# Patient Record
Sex: Female | Born: 1955 | Race: White | Hispanic: No | Smoking: Never smoker
Health system: Southern US, Community
[De-identification: ages and names within clinical notes are randomized; demographics above are authoritative.]

## PROBLEM LIST (undated history)

## (undated) DIAGNOSIS — N059 Unspecified nephritic syndrome with unspecified morphologic changes: Secondary | ICD-10-CM

## (undated) DIAGNOSIS — I509 Heart failure, unspecified: Secondary | ICD-10-CM

## (undated) DIAGNOSIS — I10 Essential (primary) hypertension: Secondary | ICD-10-CM

## (undated) DIAGNOSIS — Z9889 Other specified postprocedural states: Secondary | ICD-10-CM

## (undated) DIAGNOSIS — N289 Disorder of kidney and ureter, unspecified: Secondary | ICD-10-CM

## (undated) DIAGNOSIS — F329 Major depressive disorder, single episode, unspecified: Secondary | ICD-10-CM

## (undated) DIAGNOSIS — M810 Age-related osteoporosis without current pathological fracture: Secondary | ICD-10-CM

## (undated) DIAGNOSIS — G629 Polyneuropathy, unspecified: Secondary | ICD-10-CM

## (undated) DIAGNOSIS — E785 Hyperlipidemia, unspecified: Secondary | ICD-10-CM

## (undated) DIAGNOSIS — M1990 Unspecified osteoarthritis, unspecified site: Secondary | ICD-10-CM

## (undated) DIAGNOSIS — H40009 Preglaucoma, unspecified, unspecified eye: Secondary | ICD-10-CM

## (undated) DIAGNOSIS — E119 Type 2 diabetes mellitus without complications: Secondary | ICD-10-CM

## (undated) DIAGNOSIS — R011 Cardiac murmur, unspecified: Secondary | ICD-10-CM

## (undated) DIAGNOSIS — R112 Nausea with vomiting, unspecified: Secondary | ICD-10-CM

## (undated) DIAGNOSIS — Z9981 Dependence on supplemental oxygen: Secondary | ICD-10-CM

## (undated) DIAGNOSIS — F32A Depression, unspecified: Secondary | ICD-10-CM

## (undated) DIAGNOSIS — C801 Malignant (primary) neoplasm, unspecified: Secondary | ICD-10-CM

## (undated) DIAGNOSIS — K219 Gastro-esophageal reflux disease without esophagitis: Secondary | ICD-10-CM

## (undated) HISTORY — PX: TRACHEOSTOMY: SUR1362

## (undated) HISTORY — DX: Essential (primary) hypertension: I10

## (undated) HISTORY — PX: BREAST LUMPECTOMY: SHX2

## (undated) HISTORY — PX: SPINAL FUSION: SHX223

## (undated) HISTORY — DX: Disorder of kidney and ureter, unspecified: N28.9

## (undated) HISTORY — PX: DILATION AND CURETTAGE OF UTERUS: SHX78

## (undated) HISTORY — DX: Type 2 diabetes mellitus without complications: E11.9

## (undated) HISTORY — DX: Hyperlipidemia, unspecified: E78.5

## (undated) HISTORY — DX: Age-related osteoporosis without current pathological fracture: M81.0

## (undated) HISTORY — PX: OTHER SURGICAL HISTORY: SHX169

## (undated) HISTORY — PX: ORIF FOOT FRACTURE: SHX2123

---

## 2006-08-31 ENCOUNTER — Encounter: Admission: RE | Admit: 2006-08-31 | Discharge: 2006-08-31 | Payer: Self-pay | Admitting: Surgery

## 2015-04-19 LAB — HEMOGLOBIN A1C: HEMOGLOBIN A1C: 11.6

## 2015-05-18 ENCOUNTER — Encounter: Payer: Self-pay | Admitting: "Endocrinology

## 2015-05-18 ENCOUNTER — Ambulatory Visit (INDEPENDENT_AMBULATORY_CARE_PROVIDER_SITE_OTHER): Payer: Medicare PPO | Admitting: "Endocrinology

## 2015-05-18 VITALS — BP 160/85 | HR 82 | Ht 66.0 in | Wt 226.0 lb

## 2015-05-18 DIAGNOSIS — E118 Type 2 diabetes mellitus with unspecified complications: Secondary | ICD-10-CM

## 2015-05-18 DIAGNOSIS — IMO0002 Reserved for concepts with insufficient information to code with codable children: Secondary | ICD-10-CM | POA: Insufficient documentation

## 2015-05-18 DIAGNOSIS — Z794 Long term (current) use of insulin: Secondary | ICD-10-CM

## 2015-05-18 DIAGNOSIS — I10 Essential (primary) hypertension: Secondary | ICD-10-CM | POA: Diagnosis not present

## 2015-05-18 DIAGNOSIS — E1165 Type 2 diabetes mellitus with hyperglycemia: Secondary | ICD-10-CM

## 2015-05-18 DIAGNOSIS — E782 Mixed hyperlipidemia: Secondary | ICD-10-CM | POA: Insufficient documentation

## 2015-05-18 DIAGNOSIS — E785 Hyperlipidemia, unspecified: Secondary | ICD-10-CM

## 2015-05-18 MED ORDER — CANAGLIFLOZIN 100 MG PO TABS: 100.0000 mg | ORAL_TABLET | Freq: Every day | ORAL | Status: DC

## 2015-05-18 NOTE — Progress Notes (Signed)
Subjective:    Patient ID: Savannah Miles, female    DOB: 29-May-1955. Patient is being seen in consultation for management of diabetes requested by  Kennieth Rad, MD  Past Medical History  Diagnosis Date  . Diabetes mellitus, type II ([place])   . Hypertension   . Hyperlipidemia   . Kidney disease   . Osteoporosis    Past Surgical History  Procedure Laterality Date  . Spinal fusion    . Breast lumpectomy    . Tracheostomy    . Other surgical history  Leg, Arm, Hip fx   Social History   Social History  . Marital Status: Divorced    Spouse Name: N/A  . Number of Children: N/A  . Years of Education: N/A   Social History Main Topics  . Smoking status: Never Smoker   . Smokeless tobacco: None  . Alcohol Use: No  . Drug Use: No  . Sexual Activity: Not Asked   Other Topics Concern  . None   Social History Narrative  . None   Outpatient Encounter Prescriptions as of 05/18/2015  Medication Sig  . amLODipine (NORVASC) 10 MG tablet Take 10 mg by mouth daily.  Marland Kitchen atorvastatin (LIPITOR) 20 MG tablet Take 20 mg by mouth daily.  Marland Kitchen buPROPion (WELLBUTRIN) 75 MG tablet Take 75 mg by mouth daily.  . canagliflozin (INVOKANA) 100 MG TABS tablet Take 1 tablet (100 mg total) by mouth daily before breakfast.  . citalopram (CELEXA) 40 MG tablet Take 40 mg by mouth daily.  Marland Kitchen gabapentin (NEURONTIN) 800 MG tablet Take 800 mg by mouth. 5 x daily  . hydrochlorothiazide (HYDRODIURIL) 25 MG tablet Take 25 mg by mouth daily.  Marland Kitchen HYDROcodone-acetaminophen (NORCO/VICODIN) 5-325 MG tablet Take 1 tablet by mouth every 6 (six) hours as needed for moderate pain.  . Insulin Glargine (TOUJEO SOLOSTAR) 300 UNIT/ML SOPN Inject 60 Units into the skin at bedtime.  . insulin lispro (HUMALOG KWIKPEN) 100 UNIT/ML KiwkPen Inject 10-16 Units into the skin 3 (three) times daily with meals.  . isosorbide mononitrate (IMDUR) 30 MG 24 hr tablet Take 30 mg by mouth daily.  Marland Kitchen losartan (COZAAR) 100 MG tablet Take 100 mg  by mouth daily.  . metFORMIN (GLUCOPHAGE) 1000 MG tablet Take 1,000 mg by mouth 2 (two) times daily with a meal.  . metoprolol tartrate (LOPRESSOR) 25 MG tablet Take 25 mg by mouth 2 (two) times daily.  Marland Kitchen omeprazole (PRILOSEC) 40 MG capsule Take 40 mg by mouth daily.  . Vitamin D, Ergocalciferol, (DRISDOL) 50000 units CAPS capsule Take 50,000 Units by mouth every 7 (seven) days.  . [DISCONTINUED] phentermine 37.5 MG capsule Take 37.5 mg by mouth every morning.   No facility-administered encounter medications on file as of 05/18/2015.   ALLERGIES: Allergies not on file VACCINATION STATUS:  There is no immunization history on file for this patient.  Diabetes She presents for her initial diabetic visit. She has type 2 diabetes mellitus. Onset time: Patient was diagnosed at approximate age of 43 years. Her disease course has been worsening. There are no hypoglycemic associated symptoms. Pertinent negatives for hypoglycemia include no confusion, headaches, pallor or seizures. Associated symptoms include fatigue, polydipsia and polyuria. Pertinent negatives for diabetes include no chest pain, no polyphagia and no weight loss. There are no hypoglycemic complications. Symptoms are worsening. There are no diabetic complications. Risk factors for coronary artery disease include diabetes mellitus, dyslipidemia, hypertension and sedentary lifestyle. Current diabetic treatment includes insulin injections and oral agent (monotherapy) (  She is on toujeo 38 units in the morning and 68 units at bedtime, Humalog 28 units 3 times a day with meals. She is also on metformin 1000 mg by mouth twice a day.). Her weight is increasing steadily. She has not had a previous visit with a dietitian. She participates in exercise intermittently. Home blood sugar record trend: She did not bring the meter nor log with her to review today. An ACE inhibitor/angiotensin II receptor blocker is being taken.  Hyperlipidemia This is a  chronic problem. The current episode started more than 1 year ago. Pertinent negatives include no chest pain, myalgias or shortness of breath. Current antihyperlipidemic treatment includes statins. Risk factors for coronary artery disease include diabetes mellitus, dyslipidemia, hypertension, obesity and a sedentary lifestyle.  Hypertension This is a chronic problem. The current episode started more than 1 year ago. Pertinent negatives include no chest pain, headaches, palpitations or shortness of breath. Risk factors for coronary artery disease include diabetes mellitus, dyslipidemia, obesity and sedentary lifestyle. Past treatments include angiotensin blockers.      Review of Systems  Constitutional: Positive for fatigue. Negative for fever, chills, weight loss and unexpected weight change.  HENT: Negative for trouble swallowing and voice change.   Eyes: Negative for visual disturbance.  Respiratory: Negative for cough, shortness of breath and wheezing.   Cardiovascular: Negative for chest pain, palpitations and leg swelling.  Gastrointestinal: Negative for nausea, vomiting and diarrhea.  Endocrine: Positive for polydipsia and polyuria. Negative for cold intolerance, heat intolerance and polyphagia.  Musculoskeletal: Negative for myalgias and arthralgias.  Skin: Negative for color change, pallor, rash and wound.  Neurological: Negative for seizures and headaches.  Psychiatric/Behavioral: Negative for suicidal ideas and confusion.    Objective:    BP 160/85 mmHg  Pulse 82  Ht 5\' 6"  (1.676 m)  Wt 226 lb (102.513 kg)  BMI 36.49 kg/m2  SpO2 98%  Wt Readings from Last 3 Encounters:  05/18/15 226 lb (102.513 kg)    Physical Exam  Constitutional: She is oriented to person, place, and time. She appears well-developed.  HENT:  Head: Normocephalic and atraumatic.  Eyes: EOM are normal.  Neck: Normal range of motion. Neck supple. No tracheal deviation present. No thyromegaly present.   Cardiovascular: Normal rate and regular rhythm.   Murmur heard. She has systolic murmur which radiates to the left side of the neck. She is following with cardiology had echocardiogram in the last 2 years. Records not available to review. I advised her to continue follow up closely with cardiologist.   Pulmonary/Chest: Effort normal and breath sounds normal.  Abdominal: Soft. Bowel sounds are normal. There is no tenderness. There is no guarding.  Musculoskeletal: Normal range of motion. She exhibits no edema.  Neurological: She is alert and oriented to person, place, and time. She has normal reflexes. No cranial nerve deficit. Coordination normal.  Skin: Skin is warm and dry. No rash noted. No erythema. No pallor.  Psychiatric: She has a normal mood and affect. Judgment normal.     Diabetic Labs (most recent): Lab Results  Component Value Date   HGBA1C 11.6 04/19/2015    Labs from July 2016 included A1c of 8.5%, completely labs to be scanned into her records.   Assessment & Plan:   1. Uncontrolled type 2 diabetes mellitus with complication, with long-term current use of insulin ([place])  - Patient has currently uncontrolled symptomatic type 2 DM since  60 years of age,  with most recent A1c of 11.5 %.  Recent labs reviewed.   Her diabetes is complicated by obesity and patient remains at a high risk for more acute and chronic complications of diabetes which include CAD, CVA, CKD, retinopathy, and neuropathy. These are all discussed in detail with the patient.  - I have counseled the patient on diet management and weight loss, by adopting a carbohydrate restricted/protein rich diet.  - Suggestion is made for patient to avoid simple carbohydrates   from their diet including Cakes , Desserts, Ice Cream,  Soda (  diet and regular) , Sweet Tea , Candies,  Chips, Cookies, Artificial Sweeteners,   and "Sugar-free" Products . This will help patient to have stable blood glucose profile and  potentially avoid unintended weight gain.  - I encouraged the patient to switch to  unprocessed or minimally processed complex starch and increased protein intake (animal or plant source), fruits, and vegetables.  - Patient is advised to stick to a routine mealtimes to eat 3 meals  a day and avoid unnecessary snacks ( to snack only to correct hypoglycemia).  - The patient will be scheduled with Jearld Fenton, RDN, CDE for individualized DM education.  - I have approached patient with the following individualized plan to manage diabetes and patient agrees:   - I  will proceed to readjust her basal insulin  Toujeo to 60 units daily at bedtime, and lower her  prandial insulin Humalog to 10 units TIDAC for pre-meal BG readings of 90-150mg /dl, plus patient specific correction dose for unexpected hyperglycemia above 150mg /dl, associated with strict monitoring of glucose  AC and HS. - Patient is warned not to take insulin without proper monitoring per orders. -Adjustment parameters are given for hypo and hyperglycemia in writing. -Patient is encouraged to call clinic for blood glucose levels less than 70 or above 300 mg /dl. - I will conmetformin 1000 mg by mouth twice a day, therapeutically suitable for patient. -I will add invokana 100 mg by mouth daily, side effects and precautions discussed with patient.   - Patient will be considered for incretin therapy as appropriate next visit. - Patient specific target  A1c;  LDL, HDL, Triglycerides, and  Waist Circumference were discussed in detail.  2) BP/HTN: uncontrolled. Continue current medications including ACEI/ARB. 3) Lipids/HPL:  control unknown, continue statins. 4)  Weight/Diet:   patient is obese, CDE Consult will be initiated , exercise, and detailed carbohydrates information provided. given her significant cardiac murmur, I advised her to discontinue phentermine for now.  5) Chronic Care/Health Maintenance:  -Patient is on ACEI/ARB and  Statin medications and encouraged to continue to follow up with Ophthalmology, Podiatrist at least yearly or according to recommendations, and advised to   stay away from smoking. I have recommended yearly flu vaccine and pneumonia vaccination at least every 5 years; moderate intensity exercise for up to 150 minutes weekly; and  sleep for at least 7 hours a day.  - 60 minutes of time was spent on the care of this patient , 50% of which was applied for counseling on diabetes complications and their preventions.  - Patient to bring meter and  blood glucose logs during their next visit.   - I advised patient to maintain close follow up with Kennieth Rad, MD for primary care needs.  Follow up plan: - Return in about 1 week (around 05/25/2015) for diabetes, high blood pressure, high cholesterol, follow up with meter and logs- no labs.  Glade Lloyd, MD Phone: [phone]  Fax: [phone]   05/18/2015, 10:55 AM

## 2015-05-18 NOTE — Patient Instructions (Signed)

## 2015-05-30 ENCOUNTER — Ambulatory Visit: Payer: Medicare PPO | Admitting: "Endocrinology

## 2015-06-06 ENCOUNTER — Ambulatory Visit: Payer: Medicare PPO | Admitting: "Endocrinology

## 2015-06-08 ENCOUNTER — Encounter: Payer: Self-pay | Admitting: Nutrition

## 2015-06-08 ENCOUNTER — Encounter: Payer: Medicare PPO | Attending: "Endocrinology | Admitting: Nutrition

## 2015-06-08 VITALS — Ht 67.0 in | Wt 214.0 lb

## 2015-06-08 DIAGNOSIS — IMO0002 Reserved for concepts with insufficient information to code with codable children: Secondary | ICD-10-CM

## 2015-06-08 DIAGNOSIS — Z794 Long term (current) use of insulin: Secondary | ICD-10-CM | POA: Diagnosis present

## 2015-06-08 DIAGNOSIS — E118 Type 2 diabetes mellitus with unspecified complications: Secondary | ICD-10-CM | POA: Diagnosis not present

## 2015-06-08 DIAGNOSIS — E1165 Type 2 diabetes mellitus with hyperglycemia: Secondary | ICD-10-CM

## 2015-06-08 DIAGNOSIS — E669 Obesity, unspecified: Secondary | ICD-10-CM

## 2015-06-08 NOTE — Progress Notes (Signed)
  Medical Nutrition Therapy:  Appt start time: 1330 end time:  1430.   Assessment:  Primary concerns today: Diabetes Type 2. A1C 11.6% 1/17. . Lives with herself. Eats meals eaten at home. She doesn't cook a lot; gets more conveniet foods. 60 units of Toujeo and 10 units of Humlog with meals. Eates three meals per day. Testing blood sugar 4 times per day. Injects in stomach area mostly. Physical actvity: soemtimes goes to water aerobics. Has broke left hip a few years ago. Diet has improved over the last few week since she has been seeing Dr. Dorris Fetch. Trying to avoid snacks and eating better balanced meals. Diet needs to be low fat low sodium high fiber.    Lab Results  Component Value Date   HGBA1C 11.6 04/19/2015    Preferred Learning Style:   No preference indicated   Learning Readiness:  Ready  Change in progress  MEDICATIONS: See list  24-hr recall:  B ( AM): egg 2, bacon or oatmeal,  and water Snk ( AM): celery L ( PM): Hot dog with bun and water with mustard, chili and slaw Snk ( PM):  D ( PM): Spaghetti 2 cup, 1 bread stick, water. Snk ( PM): none Beverages: water  Usual physical activity: walks some  Estimated energy needs: 1500 calories 170 g carbohydrates 112 g protein 42 g fat  Progress Towards Goal(s):  In progress.   Nutritional Diagnosis:  NB-1.1 Food and nutrition-related knowledge deficit As related to Diabetes.  As evidenced by A1C 11.7%.    Intervention:  Nutrition and Diabetes education provided on My Plate, CHO counting, meal planning, portion sizes, timing of meals, avoiding snacks between meals unless having a low blood sugar, target ranges for A1C and blood sugars, signs/symptoms and treatment of hyper/hypoglycemia, monitoring blood sugars, taking medications as prescribed, benefits of exercising 30 minutes per day and prevention of complications of DM.  Goals 1. Follow My Plate Method 2 Avoid snacks between meals 3. Take insulin as  prescribed: Toujeo at night and Humalog/Novolog with meals. 4. Increase water intake 5. Walk 15-30 minutes daily. 6. Do not skip meals. 7. Get A1C down to 8% in three months.   Teaching Method Utilized:  Visual Auditory Hands on  Handouts given during visit include:  The Plate Method  Meal Plan Card  Diabetes Instructions   Barriers to learning/adherence to lifestyle change:  None  Demonstrated degree of understanding via:  Teach Back   Monitoring/Evaluation:  Dietary intake, exercise,  Meal planning, SBG, and body weight in 1 month(s).

## 2015-06-12 ENCOUNTER — Ambulatory Visit: Payer: Medicare PPO | Admitting: "Endocrinology

## 2015-06-12 NOTE — Patient Instructions (Addendum)
Goals 1. Follow My Plate Method 2 Avoid snacks between meals 3. Take insulin as prescribed: Toujeo at night and Humalog/Novolog with meals. 4. Increase water intake 5. Walk 15-30 minutes daily. 6. Do not skip meals. 7. Get A1C down to 8% in three months

## 2015-06-21 ENCOUNTER — Encounter: Payer: Self-pay | Admitting: "Endocrinology

## 2015-06-21 ENCOUNTER — Ambulatory Visit (INDEPENDENT_AMBULATORY_CARE_PROVIDER_SITE_OTHER): Payer: Medicare PPO | Admitting: "Endocrinology

## 2015-06-21 VITALS — BP 122/74 | HR 63 | Ht 67.0 in | Wt 220.0 lb

## 2015-06-21 DIAGNOSIS — E118 Type 2 diabetes mellitus with unspecified complications: Secondary | ICD-10-CM

## 2015-06-21 DIAGNOSIS — E785 Hyperlipidemia, unspecified: Secondary | ICD-10-CM | POA: Diagnosis not present

## 2015-06-21 DIAGNOSIS — IMO0002 Reserved for concepts with insufficient information to code with codable children: Secondary | ICD-10-CM

## 2015-06-21 DIAGNOSIS — I10 Essential (primary) hypertension: Secondary | ICD-10-CM | POA: Diagnosis not present

## 2015-06-21 DIAGNOSIS — Z794 Long term (current) use of insulin: Secondary | ICD-10-CM

## 2015-06-21 DIAGNOSIS — E1165 Type 2 diabetes mellitus with hyperglycemia: Secondary | ICD-10-CM

## 2015-06-21 NOTE — Progress Notes (Signed)
Subjective:    Patient ID: Savannah Miles, female    DOB: 05-30-1955. Patient is being seen in consultation for management of diabetes requested by  Kennieth Rad, MD  Past Medical History  Diagnosis Date  . Diabetes mellitus, type II ([place])   . Hypertension   . Hyperlipidemia   . Kidney disease   . Osteoporosis    Past Surgical History  Procedure Laterality Date  . Spinal fusion    . Breast lumpectomy    . Tracheostomy    . Other surgical history  Leg, Arm, Hip fx   Social History   Social History  . Marital Status: Divorced    Spouse Name: N/A  . Number of Children: N/A  . Years of Education: N/A   Social History Main Topics  . Smoking status: Never Smoker   . Smokeless tobacco: None  . Alcohol Use: No  . Drug Use: No  . Sexual Activity: Not Asked   Other Topics Concern  . None   Social History Narrative   Outpatient Encounter Prescriptions as of 06/21/2015  Medication Sig  . amLODipine (NORVASC) 10 MG tablet Take 10 mg by mouth daily.  Marland Kitchen atorvastatin (LIPITOR) 20 MG tablet Take 20 mg by mouth daily.  Marland Kitchen buPROPion (WELLBUTRIN) 75 MG tablet Take 75 mg by mouth daily.  . citalopram (CELEXA) 40 MG tablet Take 40 mg by mouth daily.  Marland Kitchen gabapentin (NEURONTIN) 800 MG tablet Take 800 mg by mouth. 5 x daily  . hydrochlorothiazide (HYDRODIURIL) 25 MG tablet Take 25 mg by mouth daily.  Marland Kitchen HYDROcodone-acetaminophen (NORCO/VICODIN) 5-325 MG tablet Take 1 tablet by mouth every 6 (six) hours as needed for moderate pain.  . Insulin Glargine (TOUJEO SOLOSTAR) 300 UNIT/ML SOPN Inject 60 Units into the skin at bedtime.  . insulin lispro (HUMALOG KWIKPEN) 100 UNIT/ML KiwkPen Inject 10-16 Units into the skin 3 (three) times daily with meals.  . isosorbide mononitrate (IMDUR) 30 MG 24 hr tablet Take 30 mg by mouth daily.  Marland Kitchen losartan (COZAAR) 100 MG tablet Take 100 mg by mouth daily.  . metFORMIN (GLUCOPHAGE) 1000 MG tablet Take 1,000 mg by mouth 2 (two) times daily with a meal.  .  metoprolol tartrate (LOPRESSOR) 25 MG tablet Take 25 mg by mouth 2 (two) times daily.  Marland Kitchen omeprazole (PRILOSEC) 40 MG capsule Take 40 mg by mouth daily.  . Vitamin D, Ergocalciferol, (DRISDOL) 50000 units CAPS capsule Take 50,000 Units by mouth every 7 (seven) days.  . [DISCONTINUED] canagliflozin (INVOKANA) 100 MG TABS tablet Take 1 tablet (100 mg total) by mouth daily before breakfast. (Patient not taking: Reported on 06/21/2015)   No facility-administered encounter medications on file as of 06/21/2015.   ALLERGIES: No Known Allergies VACCINATION STATUS:  There is no immunization history on file for this patient.  Diabetes She presents for her initial diabetic visit. She has type 2 diabetes mellitus. Onset time: Patient was diagnosed at approximate age of 37 years. Her disease course has been improving. There are no hypoglycemic associated symptoms. Pertinent negatives for hypoglycemia include no confusion, headaches, pallor or seizures. Associated symptoms include fatigue and polydipsia. Pertinent negatives for diabetes include no chest pain, no polyphagia, no polyuria and no weight loss. There are no hypoglycemic complications. Symptoms are worsening. There are no diabetic complications. Risk factors for coronary artery disease include diabetes mellitus, dyslipidemia, hypertension and sedentary lifestyle. Current diabetic treatment includes insulin injections and oral agent (monotherapy) (She is on toujeo 38 units in the morning and  68 units at bedtime, Humalog 28 units 3 times a day with meals. She is also on metformin 1000 mg by mouth twice a day.). Her weight is decreasing steadily (She lost 6 pounds when she was on Invokana, however her insurance did not pay for Invokana and could not continue.). She has not had a previous visit with a dietitian. She participates in exercise intermittently. Home blood sugar record trend: She did  bring a meter showing 1-2 readings per day, despite my advice for  her to test 4 times a day.  Her overall blood glucose range is 180-200 mg/dl. An ACE inhibitor/angiotensin II receptor blocker is being taken.  Hyperlipidemia This is a chronic problem. The current episode started more than 1 year ago. Pertinent negatives include no chest pain, myalgias or shortness of breath. Current antihyperlipidemic treatment includes statins. Risk factors for coronary artery disease include diabetes mellitus, dyslipidemia, hypertension, obesity and a sedentary lifestyle.  Hypertension This is a chronic problem. The current episode started more than 1 year ago. Pertinent negatives include no chest pain, headaches, palpitations or shortness of breath. Risk factors for coronary artery disease include diabetes mellitus, dyslipidemia, obesity and sedentary lifestyle. Past treatments include angiotensin blockers.      Review of Systems  Constitutional: Positive for fatigue. Negative for fever, chills, weight loss and unexpected weight change.  HENT: Negative for trouble swallowing and voice change.   Eyes: Negative for visual disturbance.  Respiratory: Negative for cough, shortness of breath and wheezing.   Cardiovascular: Negative for chest pain, palpitations and leg swelling.  Gastrointestinal: Negative for nausea, vomiting and diarrhea.  Endocrine: Positive for polydipsia. Negative for cold intolerance, heat intolerance, polyphagia and polyuria.  Musculoskeletal: Negative for myalgias and arthralgias.  Skin: Negative for color change, pallor, rash and wound.  Neurological: Negative for seizures and headaches.  Psychiatric/Behavioral: Negative for suicidal ideas and confusion.    Objective:    BP 122/74 mmHg  Pulse 63  Ht 5\' 7"  (1.702 m)  Wt 220 lb (99.791 kg)  BMI 34.45 kg/m2  SpO2 95%  Wt Readings from Last 3 Encounters:  06/21/15 220 lb (99.791 kg)  06/08/15 214 lb (97.07 kg)  05/18/15 226 lb (102.513 kg)    Physical Exam  Constitutional: She is oriented to  person, place, and time. She appears well-developed.  HENT:  Head: Normocephalic and atraumatic.  Eyes: EOM are normal.  Neck: Normal range of motion. Neck supple. No tracheal deviation present. No thyromegaly present.  Cardiovascular: Normal rate and regular rhythm.   Murmur heard. She has systolic murmur which radiates to the left side of the neck. She is following with cardiology had echocardiogram in the last 2 years. Records not available to review. I advised her to continue follow up closely with cardiologist.   Pulmonary/Chest: Effort normal and breath sounds normal.  Abdominal: Soft. Bowel sounds are normal. There is no tenderness. There is no guarding.  Musculoskeletal: Normal range of motion. She exhibits no edema.  Neurological: She is alert and oriented to person, place, and time. She has normal reflexes. No cranial nerve deficit. Coordination normal.  Skin: Skin is warm and dry. No rash noted. No erythema. No pallor.  Psychiatric: She has a normal mood and affect. Judgment normal.     Diabetic Labs (most recent): Lab Results  Component Value Date   HGBA1C 11.6 04/19/2015    Labs from July 2016 included A1c of 8.5%, completely labs to be scanned into her records.   Assessment & Plan:  1. Uncontrolled type 2 diabetes mellitus with complication, with long-term current use of insulin ([place])  - Patient has currently uncontrolled symptomatic type 2 DM since  60 years of age,  with most recent A1c of 11.5 %. Recent labs reviewed.   Her diabetes is complicated by obesity and patient remains at a high risk for more acute and chronic complications of diabetes which include CAD, CVA, CKD, retinopathy, and neuropathy. These are all discussed in detail with the patient.  - I have counseled the patient on diet management and weight loss, by adopting a carbohydrate restricted/protein rich diet.  - Suggestion is made for patient to avoid simple carbohydrates   from their diet  including Cakes , Desserts, Ice Cream,  Soda (  diet and regular) , Sweet Tea , Candies,  Chips, Cookies, Artificial Sweeteners,   and "Sugar-free" Products . This will help patient to have stable blood glucose profile and potentially avoid unintended weight gain.  - I encouraged the patient to switch to  unprocessed or minimally processed complex starch and increased protein intake (animal or plant source), fruits, and vegetables.  - Patient is advised to stick to a routine mealtimes to eat 3 meals  a day and avoid unnecessary snacks ( to snack only to correct hypoglycemia).  - The patient will be scheduled with Jearld Fenton, RDN, CDE for individualized DM education.  - I have approached patient with the following individualized plan to manage diabetes and patient agrees:   - She came with inadequate monitoring and was no logs. I  will continue with basal insulin Toujeo  60 units daily at bedtime, and  Humalog  10 units TIDAC for pre-meal BG readings of 90-150mg /dl, plus patient specific correction dose for unexpected hyperglycemia above 150mg /dl, associated with strict monitoring of glucose  AC and HS. - Patient is warned not to take insulin without proper monitoring per orders. -Adjustment parameters are given for hypo and hyperglycemia in writing. -Patient is encouraged to call clinic for blood glucose levels less than 70 or above 300 mg /dl. - I will conmetformin 1000 mg by mouth twice a day, therapeutically suitable for patient. - She could not afford invokana   after free trial for 30 days. I gave her  a brochure for patient assistance program for [employer].     - Patient will be considered for incretin therapy as appropriate next visit. - Patient specific target  A1c;  LDL, HDL, Triglycerides, and  Waist Circumference were discussed in detail.  2) BP/HTN: uncontrolled. Continue current medications including ACEI/ARB. 3) Lipids/HPL:  control unknown, continue statins. 4)   Weight/Diet:   patient is obese, CDE Consult will be initiated , exercise, and detailed carbohydrates information provided. given her significant cardiac murmur, I advised her to discontinue phentermine for now.  5) Chronic Care/Health Maintenance:  -Patient is on ACEI/ARB and Statin medications and encouraged to continue to follow up with Ophthalmology, Podiatrist at least yearly or according to recommendations, and advised to   stay away from smoking. I have recommended yearly flu vaccine and pneumonia vaccination at least every 5 years; moderate intensity exercise for up to 150 minutes weekly; and  sleep for at least 7 hours a day.  - 25 minutes of time was spent on the care of this patient , 50% of which was applied for counseling on diabetes complications and their preventions.  - Patient to bring meter and  blood glucose logs during their next visit.   - I advised patient  to maintain close follow up with Kennieth Rad, MD for primary care needs.  Follow up plan: - Return in about 8 weeks (around 08/16/2015) for diabetes, high blood pressure, high cholesterol, follow up with pre-visit labs, meter, and logs.  Glade Lloyd, MD Phone: [phone]  Fax: [phone]   06/21/2015, 2:30 PM

## 2015-06-21 NOTE — Patient Instructions (Signed)
Advice for weight management -For most of us the best way to lose weight is by diet management. Generally speaking, diet management means restricting carbohydrate consumption to minimum possible (and to unprocessed or minimally processed complex starch) and increasing protein intake (animal or plant source), fruits, and vegetables.  -Sticking to a routine mealtime to eat 3 meals a day and avoiding unnecessary snacks is shown to have a big role in weight control.  -It is better to avoid simple carbohydrates including: Cakes, Desserts, Ice Cream, Soda (diet and regular), Sweet Tea, Candies, Chips, Cookies, Artificial Sweeteners, and "Sugar-free" Products.   -Exercise: 30 minutes a day 3-4 days a week, or 150 minutes a week. Combine stretch, strength, and aerobic activities. You may seek evaluation by your heart doctor prior to initiating exercise if you have high risk for heart disease.  -If you are interested, we can schedule a visit with Savannah Miles, RDN, CDE for individualized nutrition education.  

## 2015-07-13 LAB — HEMOGLOBIN A1C: Hemoglobin A1C: 9.2

## 2015-07-17 ENCOUNTER — Ambulatory Visit: Payer: Medicare PPO | Admitting: Nutrition

## 2015-07-26 ENCOUNTER — Encounter (INDEPENDENT_AMBULATORY_CARE_PROVIDER_SITE_OTHER): Payer: Self-pay | Admitting: *Deleted

## 2015-08-15 ENCOUNTER — Encounter: Payer: Self-pay | Admitting: "Endocrinology

## 2015-08-15 ENCOUNTER — Ambulatory Visit (INDEPENDENT_AMBULATORY_CARE_PROVIDER_SITE_OTHER): Payer: Medicare PPO | Admitting: "Endocrinology

## 2015-08-15 ENCOUNTER — Ambulatory Visit: Payer: Medicare PPO | Admitting: "Endocrinology

## 2015-08-15 VITALS — BP 115/62 | HR 70 | Ht 67.0 in | Wt 228.0 lb

## 2015-08-15 DIAGNOSIS — E785 Hyperlipidemia, unspecified: Secondary | ICD-10-CM

## 2015-08-15 DIAGNOSIS — Z794 Long term (current) use of insulin: Secondary | ICD-10-CM | POA: Diagnosis not present

## 2015-08-15 DIAGNOSIS — E1165 Type 2 diabetes mellitus with hyperglycemia: Secondary | ICD-10-CM | POA: Diagnosis not present

## 2015-08-15 DIAGNOSIS — E118 Type 2 diabetes mellitus with unspecified complications: Secondary | ICD-10-CM | POA: Diagnosis not present

## 2015-08-15 DIAGNOSIS — I10 Essential (primary) hypertension: Secondary | ICD-10-CM | POA: Diagnosis not present

## 2015-08-15 DIAGNOSIS — IMO0002 Reserved for concepts with insufficient information to code with codable children: Secondary | ICD-10-CM

## 2015-08-15 NOTE — Progress Notes (Signed)
Subjective:    Patient ID: Savannah Miles, female    DOB: 1955/07/03. Patient is being seen in Follow-up for management of diabetes requested by  Kennieth Rad, MD  Past Medical History  Diagnosis Date  . Diabetes mellitus, type II ([place] [address])   . Hypertension   . Hyperlipidemia   . Kidney disease   . Osteoporosis    Past Surgical History  Procedure Laterality Date  . Spinal fusion    . Breast lumpectomy    . Tracheostomy    . Other surgical history  Leg, Arm, Hip fx   Social History   Social History  . Marital Status: Divorced    Spouse Name: N/A  . Number of Children: N/A  . Years of Education: N/A   Social History Main Topics  . Smoking status: Never Smoker   . Smokeless tobacco: None  . Alcohol Use: No  . Drug Use: No  . Sexual Activity: Not Asked   Other Topics Concern  . None   Social History Narrative   Outpatient Encounter Prescriptions as of 08/15/2015  Medication Sig  . amLODipine (NORVASC) 10 MG tablet Take 10 mg by mouth daily.  Marland Kitchen atorvastatin (LIPITOR) 20 MG tablet Take 20 mg by mouth daily.  Marland Kitchen buPROPion (WELLBUTRIN) 75 MG tablet Take 75 mg by mouth daily.  . citalopram (CELEXA) 40 MG tablet Take 40 mg by mouth daily.  Marland Kitchen gabapentin (NEURONTIN) 800 MG tablet Take 800 mg by mouth. 5 x daily  . hydrochlorothiazide (HYDRODIURIL) 25 MG tablet Take 25 mg by mouth daily.  Marland Kitchen HYDROcodone-acetaminophen (NORCO/VICODIN) 5-325 MG tablet Take 1 tablet by mouth every 6 (six) hours as needed for moderate pain.  . Insulin Glargine (TOUJEO SOLOSTAR) 300 UNIT/ML SOPN Inject 70 Units into the skin at bedtime.  . insulin lispro (HUMALOG KWIKPEN) 100 UNIT/ML KiwkPen Inject 10-16 Units into the skin 3 (three) times daily with meals.  . isosorbide mononitrate (IMDUR) 30 MG 24 hr tablet Take 30 mg by mouth daily.  Marland Kitchen losartan (COZAAR) 100 MG tablet Take 100 mg by mouth daily.  . metFORMIN (GLUCOPHAGE) 1000 MG tablet Take 1,000 mg by mouth 2 (two) times daily with a meal.  .  metoprolol tartrate (LOPRESSOR) 25 MG tablet Take 25 mg by mouth 2 (two) times daily.  Marland Kitchen omeprazole (PRILOSEC) 40 MG capsule Take 40 mg by mouth daily.  . Vitamin D, Ergocalciferol, (DRISDOL) 50000 units CAPS capsule Take 50,000 Units by mouth every 7 (seven) days.   No facility-administered encounter medications on file as of 08/15/2015.   ALLERGIES: No Known Allergies VACCINATION STATUS:  There is no immunization history on file for this patient.  Diabetes She presents for her initial diabetic visit. She has type 2 diabetes mellitus. Onset time: Patient was diagnosed at approximate age of 60 years. Her disease course has been improving. There are no hypoglycemic associated symptoms. Pertinent negatives for hypoglycemia include no confusion, headaches, pallor or seizures. Associated symptoms include fatigue and polydipsia. Pertinent negatives for diabetes include no chest pain, no polyphagia, no polyuria and no weight loss. There are no hypoglycemic complications. Symptoms are worsening. There are no diabetic complications. Risk factors for coronary artery disease include diabetes mellitus, dyslipidemia, hypertension and sedentary lifestyle. Current diabetic treatment includes insulin injections and oral agent (monotherapy) (She is on toujeo 38 units in the morning and 68 units at bedtime, Humalog 28 units 3 times a day with meals. She is also on metformin 1000 mg by mouth twice a day.). Her  weight is increasing steadily. She is following a generally unhealthy diet. She has not had a previous visit with a dietitian. She participates in exercise intermittently. Her breakfast blood glucose range is generally 180-200 mg/dl. Her lunch blood glucose range is generally 180-200 mg/dl. Her dinner blood glucose range is generally 180-200 mg/dl. Her overall blood glucose range is 180-200 mg/dl. An ACE inhibitor/angiotensin II receptor blocker is being taken.  Hyperlipidemia This is a chronic problem. The  current episode started more than 1 year ago. Pertinent negatives include no chest pain, myalgias or shortness of breath. Current antihyperlipidemic treatment includes statins. Risk factors for coronary artery disease include diabetes mellitus, dyslipidemia, hypertension, obesity and a sedentary lifestyle.  Hypertension This is a chronic problem. The current episode started more than 1 year ago. Pertinent negatives include no chest pain, headaches, palpitations or shortness of breath. Risk factors for coronary artery disease include diabetes mellitus, dyslipidemia, obesity and sedentary lifestyle. Past treatments include angiotensin blockers.      Review of Systems  Constitutional: Positive for fatigue. Negative for fever, chills, weight loss and unexpected weight change.  HENT: Negative for trouble swallowing and voice change.   Eyes: Negative for visual disturbance.  Respiratory: Negative for cough, shortness of breath and wheezing.   Cardiovascular: Negative for chest pain, palpitations and leg swelling.  Gastrointestinal: Negative for nausea, vomiting and diarrhea.  Endocrine: Positive for polydipsia. Negative for cold intolerance, heat intolerance, polyphagia and polyuria.  Musculoskeletal: Negative for myalgias and arthralgias.  Skin: Negative for color change, pallor, rash and wound.  Neurological: Negative for seizures and headaches.  Psychiatric/Behavioral: Negative for suicidal ideas and confusion.    Objective:    BP 115/62 mmHg  Pulse 70  Ht 5\' 7"  (1.702 m)  Wt 228 lb (103.42 kg)  BMI 35.70 kg/m2  SpO2 97%  Wt Readings from Last 3 Encounters:  08/15/15 228 lb (103.42 kg)  06/21/15 220 lb (99.791 kg)  06/08/15 214 lb (97.07 kg)    Physical Exam  Constitutional: She is oriented to person, place, and time. She appears well-developed.  HENT:  Head: Normocephalic and atraumatic.  Eyes: EOM are normal.  Neck: Normal range of motion. Neck supple. No tracheal deviation  present. No thyromegaly present.  Cardiovascular: Normal rate and regular rhythm.   Murmur heard. She has systolic murmur which radiates to the left side of the neck. She is following with cardiology had echocardiogram in the last 2 years. Records not available to review. I advised her to continue follow up closely with cardiologist.   Pulmonary/Chest: Effort normal and breath sounds normal.  Abdominal: Soft. Bowel sounds are normal. There is no tenderness. There is no guarding.  Musculoskeletal: Normal range of motion. She exhibits no edema.  Neurological: She is alert and oriented to person, place, and time. She has normal reflexes. No cranial nerve deficit. Coordination normal.  Skin: Skin is warm and dry. No rash noted. No erythema. No pallor.  Psychiatric: She has a normal mood and affect. Judgment normal.     Diabetic Labs (most recent): Lab Results  Component Value Date   HGBA1C 9.2 07/13/2015   HGBA1C 11.6 04/19/2015    Labs from July 2016 included A1c of 8.5%, completely labs to be scanned into her records.   Assessment & Plan:   1. Uncontrolled type 2 diabetes mellitus with complication, with long-term current use of insulin ([place])  - Patient has currently uncontrolled symptomatic type 2 DM since  60 years of age,  with most recent  A1c of  9.2% improving from 11.5 %. Recent labs reviewed.   Her diabetes is complicated by obesity and patient remains at a high risk for more acute and chronic complications of diabetes which include CAD, CVA, CKD, retinopathy, and neuropathy. These are all discussed in detail with the patient.  - I have counseled the patient on diet management and weight loss, by adopting a carbohydrate restricted/protein rich diet.  - Suggestion is made for patient to avoid simple carbohydrates   from their diet including Cakes , Desserts, Ice Cream,  Soda (  diet and regular) , Sweet Tea , Candies,  Chips, Cookies, Artificial Sweeteners,   and "Sugar-free"  Products . This will help patient to have stable blood glucose profile and potentially avoid unintended weight gain.  - I encouraged the patient to switch to  unprocessed or minimally processed complex starch and increased protein intake (animal or plant source), fruits, and vegetables.  - Patient is advised to stick to a routine mealtimes to eat 3 meals  a day and avoid unnecessary snacks ( to snack only to correct hypoglycemia).  - The patient will be scheduled with Jearld Fenton, RDN, CDE for individualized DM education.  - I have approached patient with the following individualized plan to manage diabetes and patient agrees:    I  will increase basal insulin Toujeo  To 70 units daily at bedtime, and  Humalog  10 units TIDAC for pre-meal BG readings of 90-150mg /dl, plus patient specific correction dose for unexpected hyperglycemia above 150mg /dl, associated with strict monitoring of glucose  AC and HS. - Patient is warned not to take insulin without proper monitoring per orders. -Adjustment parameters are given for hypo and hyperglycemia in writing. -Patient is encouraged to call clinic for blood glucose levels less than 70 or above 300 mg /dl. - I will continue metformin 1000 mg by mouth twice a day, therapeutically suitable for patient. - She could not afford invokana   after free trial for 30 days. - Patient will be considered for incretin therapy as appropriate next visit. - Patient specific target  A1c;  LDL, HDL, Triglycerides, and  Waist Circumference were discussed in detail.  2) BP/HTN: uncontrolled. Continue current medications including ACEI/ARB. 3) Lipids/HPL:  control unknown, continue statins. 4)  Weight/Diet:   patient is obese, CDE Consult will be initiated , exercise, and detailed carbohydrates information provided. given her significant cardiac murmur, I advised her to discontinue phentermine for now.  5) Chronic Care/Health Maintenance:  -Patient is on ACEI/ARB and  Statin medications and encouraged to continue to follow up with Ophthalmology, Podiatrist at least yearly or according to recommendations, and advised to   stay away from smoking. I have recommended yearly flu vaccine and pneumonia vaccination at least every 5 years; moderate intensity exercise for up to 150 minutes weekly; and  sleep for at least 7 hours a day.  - 25 minutes of time was spent on the care of this patient , 50% of which was applied for counseling on diabetes complications and their preventions. -Given her complaint of fatigue and history of systolic murmur suggestive of valvular insufficiency, I advised her to call and follow up with her cardiologist. - Patient to bring meter and  blood glucose logs during their next visit.   - I advised patient to maintain close follow up with Kennieth Rad, MD for primary care needs.  Follow up plan: - Return in about 3 months (around 11/15/2015) for diabetes, high blood pressure, high cholesterol,  follow up with pre-visit labs, meter, and logs.  Glade Lloyd, MD Phone: [phone]  Fax: [phone]   08/15/2015, 10:28 AM

## 2015-09-20 ENCOUNTER — Encounter (INDEPENDENT_AMBULATORY_CARE_PROVIDER_SITE_OTHER): Payer: Self-pay | Admitting: *Deleted

## 2015-09-20 ENCOUNTER — Other Ambulatory Visit (INDEPENDENT_AMBULATORY_CARE_PROVIDER_SITE_OTHER): Payer: Self-pay | Admitting: *Deleted

## 2015-09-20 DIAGNOSIS — Z1211 Encounter for screening for malignant neoplasm of colon: Secondary | ICD-10-CM

## 2015-09-20 NOTE — Telephone Encounter (Signed)
Patient needs trilyte 

## 2015-09-21 MED ORDER — PEG 3350-KCL-NA BICARB-NACL 420 G PO SOLR: 4000.0000 mL | Freq: Once | ORAL | Status: DC

## 2015-09-28 ENCOUNTER — Telehealth (INDEPENDENT_AMBULATORY_CARE_PROVIDER_SITE_OTHER): Payer: Self-pay | Admitting: *Deleted

## 2015-09-28 NOTE — Telephone Encounter (Signed)
Referring MD/PCP: desai   Procedure: tcs  Reason/Indication:  screening  Has patient had this procedure before?  Yes, 10-11 yrs ago  If so, when, by whom and where?    Is there a family history of colon cancer?  no  Who?  What age when diagnosed?    Is patient diabetic?   yes      Does patient have prosthetic heart valve or mechanical valve?  no  Do you have a pacemaker?  no  Has patient ever had endocarditis? no  Has patient had joint replacement within last 12 months?  no  Does patient tend to be constipated or take laxatives? some  Does patient have a history of alcohol/drug use?  no  Is patient on Coumadin, Plavix and/or Aspirin? no  Medications: see epic  Allergies: nkda  Medication Adjustment: take 1/2 normal dose of humalog day before procedure, decrease toujeo to 30 units night before procedure, don't take metformin & invokana day before procedure  Procedure date & time: 10/27/15 at 100

## 2015-10-04 NOTE — Telephone Encounter (Signed)
agree

## 2015-10-18 NOTE — Patient Instructions (Signed)
Savannah Miles  10/18/2015     @PREFPERIOPPHARMACY @   Your procedure is scheduled on  10/27/2015   Report to [facility] at  700  A.M.  Call this number if you have problems the morning of surgery:  [phone]   Remember:  Do not eat food or drink liquids after midnight.  Take these medicines the morning of surgery with A SIP OF WATER  Norvasc, wellbutrin, clexa, neurontin, hydrocodone, imdur, cozaar, metoprolol, prilosec. Take 1/2 of your usual insulin dosage the night before your surgery. DO NOT take any medications for diabetes the morning of surgery.   Do not wear jewelry, make-up or nail polish.  Do not wear lotions, powders, or perfumes.  You may wear deoderant.  Do not shave 48 hours prior to surgery.  Men may shave face and neck.  Do not bring valuables to the hospital.  [facility] is not responsible for any belongings or valuables.  Contacts, dentures or bridgework may not be worn into surgery.  Leave your suitcase in the car.  After surgery it may be brought to your room.  For patients admitted to the hospital, discharge time will be determined by your treatment team.  Patients discharged the day of surgery will not be allowed to drive home.   Name and phone number of your driver:   family Special instructions:  Follow the diet and prep instructions given to you by Dr Olevia Perches office.  Please read over the following fact sheets that you were given. Coughing and Deep Breathing, Surgical Site Infection Prevention, Anesthesia Post-op Instructions and Care and Recovery After Surgery      Colonoscopy A colonoscopy is an exam to look at the entire large intestine (colon). This exam can help find problems such as tumors, polyps, inflammation, and areas of bleeding. The exam takes about 1 hour.  LET [facility] CARE PROVIDER KNOW ABOUT:   Any allergies you have.  All medicines you are taking, including vitamins, herbs, eye drops, creams, and  over-the-counter medicines.  Previous problems you or members of your family have had with the use of anesthetics.  Any blood disorders you have.  Previous surgeries you have had.  Medical conditions you have. RISKS AND COMPLICATIONS  Generally, this is a safe procedure. However, as with any procedure, complications can occur. Possible complications include:  Bleeding.  Tearing or rupture of the colon wall.  Reaction to medicines given during the exam.  Infection (rare). BEFORE THE PROCEDURE   Ask your health care provider about changing or stopping your regular medicines.  You may be prescribed an oral bowel prep. This involves drinking a large amount of medicated liquid, starting the day before your procedure. The liquid will cause you to have multiple loose stools until your stool is almost clear or light green. This cleans out your colon in preparation for the procedure.  Do not eat or drink anything else once you have started the bowel prep, unless your health care provider tells you it is safe to do so.  Arrange for someone to drive you home after the procedure. PROCEDURE   You will be given medicine to help you relax (sedative).  You will lie on your side with your knees bent.  A long, flexible tube with a light and camera on the end (colonoscope) will be inserted through the rectum and into the colon. The camera sends video back to a computer screen as it moves through  the colon. The colonoscope also releases carbon dioxide gas to inflate the colon. This helps your health care provider see the area better.  During the exam, your health care provider may take a small tissue sample (biopsy) to be examined under a microscope if any abnormalities are found.  The exam is finished when the entire colon has been viewed. AFTER THE PROCEDURE   Do not drive for 24 hours after the exam.  You may have a small amount of blood in your stool.  You may pass moderate amounts of  gas and have mild abdominal cramping or bloating. This is caused by the gas used to inflate your colon during the exam.  Ask when your test results will be ready and how you will get your results. Make sure you get your test results.   This information is not intended to replace advice given to you by your health care provider. Make sure you discuss any questions you have with your health care provider.   Document Released: 03/15/2000 Document Revised: 01/06/2013 Document Reviewed: 11/23/2012 Elsevier Interactive Patient Education 2016 [employer] Colonoscopy, Care After Refer to this sheet in the next few weeks. These instructions provide you with information on caring for yourself after your procedure. Your health care provider may also give you more specific instructions. Your treatment has been planned according to current medical practices, but problems sometimes occur. Call your health care provider if you have any problems or questions after your procedure. WHAT TO EXPECT AFTER THE PROCEDURE  After your procedure, it is typical to have the following:  A small amount of blood in your stool.  Moderate amounts of gas and mild abdominal cramping or bloating. HOME CARE INSTRUCTIONS  Do not drive, operate machinery, or sign important documents for 24 hours.  You may shower and resume your regular physical activities, but move at a slower pace for the first 24 hours.  Take frequent rest periods for the first 24 hours.  Walk around or put a warm pack on your abdomen to help reduce abdominal cramping and bloating.  Drink enough fluids to keep your urine clear or pale yellow.  You may resume your normal diet as instructed by your health care provider. Avoid heavy or fried foods that are hard to digest.  Avoid drinking alcohol for 24 hours or as instructed by your health care provider.  Only take over-the-counter or prescription medicines as directed by your health care provider.  If  a tissue sample (biopsy) was taken during your procedure:  Do not take aspirin or blood thinners for 7 days, or as instructed by your health care provider.  Do not drink alcohol for 7 days, or as instructed by your health care provider.  Eat soft foods for the first 24 hours. SEEK MEDICAL CARE IF: You have persistent spotting of blood in your stool 2-3 days after the procedure. SEEK IMMEDIATE MEDICAL CARE IF:  You have more than a small spotting of blood in your stool.  You pass large blood clots in your stool.  Your abdomen is swollen (distended).  You have nausea or vomiting.  You have a fever.  You have increasing abdominal pain that is not relieved with medicine.   This information is not intended to replace advice given to you by your health care provider. Make sure you discuss any questions you have with your health care provider.   Document Released: 10/31/2003 Document Revised: 01/06/2013 Document Reviewed: 11/23/2012 Elsevier Interactive Patient Education [employer].  PATIENT INSTRUCTIONS POST-ANESTHESIA  IMMEDIATELY FOLLOWING SURGERY:  Do not drive or operate machinery for the first twenty four hours after surgery.  Do not make any important decisions for twenty four hours after surgery or while taking narcotic pain medications or sedatives.  If you develop intractable nausea and vomiting or a severe headache please notify your doctor immediately.  FOLLOW-UP:  Please make an appointment with your surgeon as instructed. You do not need to follow up with anesthesia unless specifically instructed to do so.  WOUND CARE INSTRUCTIONS (if applicable):  Keep a dry clean dressing on the anesthesia/puncture wound site if there is drainage.  Once the wound has quit draining you may leave it open to air.  Generally you should leave the bandage intact for twenty four hours unless there is drainage.  If the epidural site drains for more than 36-48 hours please call the anesthesia  department.  QUESTIONS?:  Please feel free to call your physician or the hospital operator if you have any questions, and they will be happy to assist you.      Monitored Anesthesia Care Monitored anesthesia care is an anesthesia service for a medical procedure. Anesthesia is the loss of the ability to feel pain. It is produced by medicines called anesthetics. It may affect a small area of your body (local anesthesia), a large area of your body (regional anesthesia), or your entire body (general anesthesia). The need for monitored anesthesia care depends your procedure, your condition, and the potential need for regional or general anesthesia. It is often provided during procedures where:   General anesthesia may be needed if there are complications. This is because you need special care when you are under general anesthesia.   You will be under local or regional anesthesia. This is so that you are able to have higher levels of anesthesia if needed.   You will receive calming medicines (sedatives). This is especially the case if sedatives are given to put you in a semi-conscious state of relaxation (deep sedation). This is because the amount of sedative needed to produce this state can be hard to predict. Too much of a sedative can produce general anesthesia. Monitored anesthesia care is performed by one or more health care providers who have special training in all types of anesthesia. You will need to meet with these health care providers before your procedure. During this meeting, they will ask you about your medical history. They will also give you instructions to follow. (For example, you will need to stop eating and drinking before your procedure. You may also need to stop or change medicines you are taking.) During your procedure, your health care providers will stay with you. They will:   Watch your condition. This includes watching your blood pressure, breathing, and level of pain.    Diagnose and treat problems that occur.   Give medicines if they are needed. These may include calming medicines (sedatives) and anesthetics.   Make sure you are comfortable.  Having monitored anesthesia care does not necessarily mean that you will be under anesthesia. It does mean that your health care providers will be able to manage anesthesia if you need it or if it occurs. It also means that you will be able to have a different type of anesthesia than you are having if you need it. When your procedure is complete, your health care providers will continue to watch your condition. They will make sure any medicines wear off before you are allowed to  go home.    This information is not intended to replace advice given to you by your health care provider. Make sure you discuss any questions you have with your health care provider.   Document Released: 12/12/2004 Document Revised: 04/08/2014 Document Reviewed: 04/29/2012 Elsevier Interactive Patient Education [employer].

## 2015-10-20 ENCOUNTER — Encounter (HOSPITAL_COMMUNITY): Payer: Self-pay

## 2015-10-20 ENCOUNTER — Encounter (HOSPITAL_COMMUNITY)
Admission: RE | Admit: 2015-10-20 | Discharge: 2015-10-20 | Disposition: A | Discharge status: Home / Self Care | Payer: Medicare PPO | Source: Ambulatory Visit | Attending: Internal Medicine | Admitting: Internal Medicine

## 2015-10-20 VITALS — BP 146/66 | HR 62 | Temp 98.6°F | Resp 16 | Ht 66.0 in | Wt 234.0 lb

## 2015-10-20 DIAGNOSIS — Z01810 Encounter for preprocedural cardiovascular examination: Secondary | ICD-10-CM | POA: Diagnosis not present

## 2015-10-20 DIAGNOSIS — Z01812 Encounter for preprocedural laboratory examination: Secondary | ICD-10-CM | POA: Insufficient documentation

## 2015-10-20 DIAGNOSIS — Z1211 Encounter for screening for malignant neoplasm of colon: Secondary | ICD-10-CM

## 2015-10-20 HISTORY — DX: Gastro-esophageal reflux disease without esophagitis: K21.9

## 2015-10-20 HISTORY — DX: Malignant (primary) neoplasm, unspecified: C80.1

## 2015-10-20 HISTORY — DX: Unspecified nephritic syndrome with unspecified morphologic changes: N05.9

## 2015-10-20 HISTORY — DX: Major depressive disorder, single episode, unspecified: F32.9

## 2015-10-20 HISTORY — DX: Cardiac murmur, unspecified: R01.1

## 2015-10-20 HISTORY — DX: Preglaucoma, unspecified, unspecified eye: H40.009

## 2015-10-20 HISTORY — DX: Unspecified osteoarthritis, unspecified site: M19.90

## 2015-10-20 HISTORY — DX: Depression, unspecified: F32.A

## 2015-10-20 LAB — BASIC METABOLIC PANEL
Anion gap: 5 (ref 5–15)
BUN: 15 mg/dL (ref 6–20)
CALCIUM: 8.5 mg/dL — AB (ref 8.9–10.3)
CO2: 27 mmol/L (ref 22–32)
CREATININE: 0.66 mg/dL (ref 0.44–1.00)
Chloride: 105 mmol/L (ref 101–111)
GFR calc Af Amer: >60 mL/min (ref >60)
GLUCOSE: 247 mg/dL — AB (ref 65–99)
Potassium: 4.3 mmol/L (ref 3.5–5.1)
SODIUM: 137 mmol/L (ref 135–145)

## 2015-10-20 LAB — CBC WITH DIFFERENTIAL/PLATELET
Basophils Absolute: 0 10*3/uL (ref 0.0–0.1)
Basophils Relative: 1 %
EOS ABS: 0.1 10*3/uL (ref 0.0–0.7)
EOS PCT: 3 %
HCT: 37.8 % (ref 36.0–46.0)
Hemoglobin: 12.3 g/dL (ref 12.0–15.0)
LYMPHS ABS: 0.8 10*3/uL (ref 0.7–4.0)
LYMPHS PCT: 20 %
MCH: 30.9 pg (ref 26.0–34.0)
MCHC: 32.5 g/dL (ref 30.0–36.0)
MCV: 95 fL (ref 78.0–100.0)
MONO ABS: 0.3 10*3/uL (ref 0.1–1.0)
Monocytes Relative: 7 %
Neutro Abs: 2.7 10*3/uL (ref 1.7–7.7)
Neutrophils Relative %: 70 %
PLATELETS: 100 10*3/uL — AB (ref 150–400)
RBC: 3.98 MIL/uL (ref 3.87–5.11)
RDW: 13.2 % (ref 11.5–15.5)
WBC: 3.8 10*3/uL — AB (ref 4.0–10.5)

## 2015-10-20 NOTE — Pre-Procedure Instructions (Signed)
Patient given information to sign up for my chart at home. Patient does not want Herbie [place], transportation to know anything about medical outcomes.

## 2015-10-20 NOTE — Progress Notes (Signed)
   10/20/15 1000  OBSTRUCTIVE SLEEP APNEA  Have you ever been diagnosed with sleep apnea through a sleep study? No  Do you snore loudly (loud enough to be heard through closed doors)?  1  Do you often feel tired, fatigued, or sleepy during the daytime (such as falling asleep during driving or talking to someone)? 0  Has anyone observed you stop breathing during your sleep? 0  Do you have, or are you being treated for high blood pressure? 1  BMI more than 35 kg/m2? 1  Age > 50 (1-yes) 1  Neck circumference greater than:Female 16 inches or larger, Female 17inches or larger? 0  Female Gender (Yes=1) 0  Obstructive Sleep Apnea Score 4  Score 5 or greater  Results sent to PCP

## 2015-10-27 ENCOUNTER — Ambulatory Visit (HOSPITAL_COMMUNITY)
Admission: RE | Admit: 2015-10-27 | Discharge: 2015-10-27 | Disposition: A | Discharge status: Home / Self Care | Payer: Medicare PPO | Source: Ambulatory Visit | Attending: Internal Medicine | Admitting: Internal Medicine

## 2015-10-27 ENCOUNTER — Encounter (HOSPITAL_COMMUNITY)
Admission: RE | Disposition: A | Discharge status: Home / Self Care | Payer: Self-pay | Source: Ambulatory Visit | Attending: Internal Medicine

## 2015-10-27 ENCOUNTER — Encounter (HOSPITAL_COMMUNITY): Payer: Self-pay | Admitting: *Deleted

## 2015-10-27 ENCOUNTER — Ambulatory Visit (HOSPITAL_COMMUNITY): Payer: Medicare PPO | Admitting: Anesthesiology

## 2015-10-27 DIAGNOSIS — K5730 Diverticulosis of large intestine without perforation or abscess without bleeding: Secondary | ICD-10-CM | POA: Diagnosis not present

## 2015-10-27 DIAGNOSIS — Z853 Personal history of malignant neoplasm of breast: Secondary | ICD-10-CM | POA: Diagnosis not present

## 2015-10-27 DIAGNOSIS — E119 Type 2 diabetes mellitus without complications: Secondary | ICD-10-CM | POA: Insufficient documentation

## 2015-10-27 DIAGNOSIS — E785 Hyperlipidemia, unspecified: Secondary | ICD-10-CM | POA: Diagnosis not present

## 2015-10-27 DIAGNOSIS — H409 Unspecified glaucoma: Secondary | ICD-10-CM | POA: Insufficient documentation

## 2015-10-27 DIAGNOSIS — I10 Essential (primary) hypertension: Secondary | ICD-10-CM | POA: Insufficient documentation

## 2015-10-27 DIAGNOSIS — Z7984 Long term (current) use of oral hypoglycemic drugs: Secondary | ICD-10-CM | POA: Diagnosis not present

## 2015-10-27 DIAGNOSIS — K644 Residual hemorrhoidal skin tags: Secondary | ICD-10-CM | POA: Diagnosis not present

## 2015-10-27 DIAGNOSIS — Z794 Long term (current) use of insulin: Secondary | ICD-10-CM | POA: Diagnosis not present

## 2015-10-27 DIAGNOSIS — M1990 Unspecified osteoarthritis, unspecified site: Secondary | ICD-10-CM | POA: Insufficient documentation

## 2015-10-27 DIAGNOSIS — R011 Cardiac murmur, unspecified: Secondary | ICD-10-CM | POA: Diagnosis not present

## 2015-10-27 DIAGNOSIS — F329 Major depressive disorder, single episode, unspecified: Secondary | ICD-10-CM | POA: Insufficient documentation

## 2015-10-27 DIAGNOSIS — D123 Benign neoplasm of transverse colon: Secondary | ICD-10-CM | POA: Insufficient documentation

## 2015-10-27 DIAGNOSIS — K219 Gastro-esophageal reflux disease without esophagitis: Secondary | ICD-10-CM | POA: Insufficient documentation

## 2015-10-27 DIAGNOSIS — Z1211 Encounter for screening for malignant neoplasm of colon: Secondary | ICD-10-CM | POA: Diagnosis not present

## 2015-10-27 DIAGNOSIS — Z79899 Other long term (current) drug therapy: Secondary | ICD-10-CM | POA: Diagnosis not present

## 2015-10-27 DIAGNOSIS — M810 Age-related osteoporosis without current pathological fracture: Secondary | ICD-10-CM | POA: Insufficient documentation

## 2015-10-27 HISTORY — PX: POLYPECTOMY: SHX5525

## 2015-10-27 HISTORY — PX: COLONOSCOPY WITH PROPOFOL: SHX5780

## 2015-10-27 LAB — GLUCOSE, CAPILLARY
GLUCOSE-CAPILLARY: 175 mg/dL — AB (ref 65–99)
Glucose-Capillary: 161 mg/dL — ABNORMAL HIGH (ref 65–99)

## 2015-10-27 SURGERY — COLONOSCOPY WITH PROPOFOL
Anesthesia: Monitor Anesthesia Care

## 2015-10-27 MED ORDER — GLYCOPYRROLATE 0.2 MG/ML IJ SOLN
INTRAMUSCULAR | Status: AC
  Filled 2015-10-27: qty 1

## 2015-10-27 MED ORDER — GLYCOPYRROLATE 0.2 MG/ML IJ SOLN
0.2000 mg | Freq: Once | INTRAMUSCULAR | Status: AC | PRN
Start: 2015-10-27 — End: 2015-10-27
  Administered 2015-10-27: 0.2 mg via INTRAVENOUS

## 2015-10-27 MED ORDER — FENTANYL CITRATE (PF) 100 MCG/2ML IJ SOLN
INTRAMUSCULAR | Status: AC
  Filled 2015-10-27: qty 2

## 2015-10-27 MED ORDER — FENTANYL CITRATE (PF) 100 MCG/2ML IJ SOLN
25.0000 ug | INTRAMUSCULAR | Status: AC | PRN
  Administered 2015-10-27 (×2): 25 ug via INTRAVENOUS

## 2015-10-27 MED ORDER — MIDAZOLAM HCL 2 MG/2ML IJ SOLN
INTRAMUSCULAR | Status: AC
  Filled 2015-10-27: qty 2

## 2015-10-27 MED ORDER — ONDANSETRON HCL 4 MG/2ML IJ SOLN
INTRAMUSCULAR | Status: AC
  Filled 2015-10-27: qty 2

## 2015-10-27 MED ORDER — ONDANSETRON HCL 4 MG/2ML IJ SOLN
4.0000 mg | Freq: Once | INTRAMUSCULAR | Status: AC
  Administered 2015-10-27: 4 mg via INTRAVENOUS

## 2015-10-27 MED ORDER — PROPOFOL 500 MG/50ML IV EMUL
INTRAVENOUS | Status: DC | PRN
  Administered 2015-10-27: 09:00:00 via INTRAVENOUS
  Administered 2015-10-27: 150 ug/kg/min via INTRAVENOUS

## 2015-10-27 MED ORDER — PROPOFOL 10 MG/ML IV BOLUS
INTRAVENOUS | Status: AC
  Filled 2015-10-27: qty 40

## 2015-10-27 MED ORDER — MIDAZOLAM HCL 2 MG/2ML IJ SOLN
1.0000 mg | INTRAMUSCULAR | Status: DC | PRN
  Administered 2015-10-27 (×3): 2 mg via INTRAVENOUS
  Filled 2015-10-27 (×2): qty 2

## 2015-10-27 MED ORDER — PROPOFOL 10 MG/ML IV BOLUS
INTRAVENOUS | Status: DC | PRN
  Administered 2015-10-27 (×2): 10 mg via INTRAVENOUS

## 2015-10-27 MED ORDER — LACTATED RINGERS IV SOLN
INTRAVENOUS | Status: DC
  Administered 2015-10-27: 08:00:00 via INTRAVENOUS

## 2015-10-27 NOTE — Discharge Instructions (Signed)
Resume usual medications and high fiber diet. No driving for 24 hours. Patient will call with biopsy results.  High-Fiber Diet Fiber, also called dietary fiber, is a type of carbohydrate found in fruits, vegetables, whole grains, and beans. A high-fiber diet can have many health benefits. Your health care provider may recommend a high-fiber diet to help:  Prevent constipation. Fiber can make your bowel movements more regular.  Lower your cholesterol.  Relieve hemorrhoids, uncomplicated diverticulosis, or irritable bowel syndrome.  Prevent overeating as part of a weight-loss plan.  Prevent heart disease, type 2 diabetes, and certain cancers. WHAT IS MY PLAN? The recommended daily intake of fiber includes:  38 grams for men under age 69.  65 grams for men over age 21.  72 grams for women under age 23.  14 grams for women over age 26. You can get the recommended daily intake of dietary fiber by eating a variety of fruits, vegetables, grains, and beans. Your health care provider may also recommend a fiber supplement if it is not possible to get enough fiber through your diet. WHAT DO I NEED TO KNOW ABOUT A HIGH-FIBER DIET?  Fiber supplements have not been widely studied for their effectiveness, so it is better to get fiber through food sources.  Always check the fiber content on thenutrition facts label of any prepackaged food. Look for foods that contain at least 5 grams of fiber per serving.  Ask your dietitian if you have questions about specific foods that are related to your condition, especially if those foods are not listed in the following section.  Increase your daily fiber consumption gradually. Increasing your intake of dietary fiber too quickly may cause bloating, cramping, or gas.  Drink plenty of water. Water helps you to digest fiber. WHAT FOODS CAN I EAT? Grains Whole-grain breads. Multigrain cereal. Oats and oatmeal. Brown rice. Barley. Bulgur wheat. [place]. Bran  muffins. Popcorn. Rye wafer crackers. Vegetables Sweet potatoes. Spinach. Kale. Artichokes. Cabbage. Broccoli. Green peas. Carrots. Squash. Fruits Berries. Pears. Apples. Oranges. Avocados. Prunes and raisins. Dried figs. Meats and Other Protein Sources Navy, kidney, pinto, and soy beans. Split peas. Lentils. Nuts and seeds. Dairy Fiber-fortified yogurt. Beverages Fiber-fortified soy milk. Fiber-fortified orange juice. Other Fiber bars. The items listed above may not be a complete list of recommended foods or beverages. Contact your dietitian for more options. WHAT FOODS ARE NOT RECOMMENDED? Grains White bread. Pasta made with refined flour. White rice. Vegetables Fried potatoes. Canned vegetables. Well-cooked vegetables.  Fruits Fruit juice. Cooked, strained fruit. Meats and Other Protein Sources Fatty cuts of meat. Fried Sales executive or fried fish. Dairy Milk. Yogurt. Cream cheese. Sour cream. Beverages Soft drinks. Other Cakes and pastries. Butter and oils. The items listed above may not be a complete list of foods and beverages to avoid. Contact your dietitian for more information. WHAT ARE SOME TIPS FOR INCLUDING HIGH-FIBER FOODS IN MY DIET?  Eat a wide variety of high-fiber foods.  Make sure that half of all grains consumed each day are whole grains.  Replace breads and cereals made from refined flour or white flour with whole-grain breads and cereals.  Replace white rice with brown rice, bulgur wheat, or millet.  Start the day with a breakfast that is high in fiber, such as a cereal that contains at least 5 grams of fiber per serving.  Use beans in place of meat in soups, salads, or pasta.  Eat high-fiber snacks, such as berries, raw vegetables, nuts, or popcorn.   This  information is not intended to replace advice given to you by your health care provider. Make sure you discuss any questions you have with your health care provider.   Document Released: 03/18/2005  Document Revised: 04/08/2014 Document Reviewed: 08/31/2013 Elsevier Interactive Patient Education [employer].

## 2015-10-27 NOTE — Op Note (Signed)
[facility] Patient Name: Savannah Miles Procedure Date: 10/27/2015 8:56 AM MRN: [id number] Date of Birth: 10/02/1955 Attending MD: Hildred Laser , MD CSN: [id number] Age: 60 Admit Type: Outpatient Procedure:                Colonoscopy Indications:              Screening for colorectal malignant neoplasm Providers:                Hildred Laser, MD, Otis Peak B. Mazy Seller, RN, Shelby Mattocks, Technician Referring MD:             Kennieth Rad, MD Medicines:                Propofol per Anesthesia Complications:            No immediate complications. Estimated Blood Loss:     Estimated blood loss was minimal. Procedure:                Pre-Anesthesia Assessment:                           - Prior to the procedure, a History and Physical                            was performed, and patient medications and                            allergies were reviewed. The patient's tolerance of                            previous anesthesia was also reviewed. The risks                            and benefits of the procedure and the sedation                            options and risks were discussed with the patient.                            All questions were answered, and informed consent                            was obtained. Prior Anticoagulants: The patient has                            taken no previous anticoagulant or antiplatelet                            agents. ASA Grade Assessment: III - A patient with                            severe systemic disease. After reviewing the risks  and benefits, the patient was deemed in                            satisfactory condition to undergo the procedure.                           After obtaining informed consent, the colonoscope                            was passed under direct vision. Throughout the                            procedure, the patient's blood pressure, pulse, and                    oxygen saturations were monitored continuously. The                            [id number]) scope was introduced through                            the anus and advanced to the the cecum, identified                            by appendiceal orifice and ileocecal valve. The                            colonoscopy was performed without difficulty. The                            patient tolerated the procedure well. The quality                            of the bowel preparation was good. The ileocecal                            valve, appendiceal orifice, and rectum were                            photographed. Scope In: 9:03:25 AM Scope Out: 9:28:44 AM Scope Withdrawal Time: 0 hours 17 minutes 3 seconds  Total Procedure Duration: 0 hours 25 minutes 19 seconds  Findings:      Scattered medium-mouthed diverticula were found in the sigmoid colon,       descending colon, transverse colon, hepatic flexure and ascending colon.      A 4 mm polyp was found in the transverse colon. The polyp was sessile.       Biopsies were taken with a cold forceps for histology.      External hemorrhoids were found during retroflexion. The hemorrhoids       were small. Impression:               - Diverticulosis in the sigmoid colon, in the                            descending colon, in the transverse colon,  at the                            hepatic flexure and in the ascending colon.                           - One 4 mm polyp in the transverse colon. Biopsied.                           - External hemorrhoids. Moderate Sedation:      Per Anesthesia Care Recommendation:           - Patient has a contact number available for                            emergencies. The signs and symptoms of potential                            delayed complications were discussed with the                            patient. Return to normal activities tomorrow.                            Written discharge  instructions were provided to the                            patient.                           - High fiber diet today.                           - Continue present medications.                           - Await pathology results.                           - Repeat colonoscopy for surveillance based on                            pathology results. Procedure Code(s):        --- Professional ---                           [phone], Colonoscopy, flexible; with biopsy, single                            or multiple Diagnosis Code(s):        --- Professional ---                           Z12.11, Encounter for screening for malignant                            neoplasm of colon  K64.4, Residual hemorrhoidal skin tags                           D12.3, Benign neoplasm of transverse colon (hepatic                            flexure or splenic flexure)                           K57.30, Diverticulosis of large intestine without                            perforation or abscess without bleeding CPT copyright 2016 American Medical Association. All rights reserved. The codes documented in this report are preliminary and upon coder review may  be revised to meet current compliance requirements. Hildred Laser, MD Hildred Laser, MD 10/27/2015 9:35:46 AM This report has been signed electronically. Number of Addenda: 0

## 2015-10-27 NOTE — Transfer of Care (Signed)
Immediate Anesthesia Transfer of Care Note  Patient: Savannah Miles  Procedure(s) Performed: Procedure(s) with comments: COLONOSCOPY WITH PROPOFOL (N/A) - 100 - moved to 8:30 POLYPECTOMY  Patient Location: PACU  Anesthesia Type:MAC  Level of Consciousness: awake  Airway & Oxygen Therapy: Patient Spontanous Breathing and Patient connected to nasal cannula oxygen  Post-op Assessment: Report given to RN  Post vital signs: Reviewed and stable  Last Vitals:  Vitals:   10/27/15 0845 10/27/15 0850  BP: (!) 148/72 (!) 158/74  Resp: 13 15  Temp:      Last Pain:  Vitals:   10/27/15 0723  TempSrc: Oral      Patients Stated Pain Goal: 7 [id number])  Complications: No apparent anesthesia complications

## 2015-10-27 NOTE — Anesthesia Postprocedure Evaluation (Signed)
Anesthesia Post Note  Patient: Savannah Miles  Procedure(s) Performed: Procedure(s) (LRB): COLONOSCOPY WITH PROPOFOL (N/A) POLYPECTOMY  Patient location during evaluation: PACU Anesthesia Type: MAC Level of consciousness: awake and alert and oriented Pain management: pain level controlled Vital Signs Assessment: post-procedure vital signs reviewed and stable Respiratory status: spontaneous breathing Cardiovascular status: blood pressure returned to baseline Postop Assessment: no signs of nausea or vomiting Anesthetic complications: no    Last Vitals:  Vitals:   10/27/15 0850 10/27/15 0936  BP: (!) 158/74 (!) (P) 150/63  Resp: 15 (P) 16  Temp:  (P) 36.6 C    Last Pain:  Vitals:   10/27/15 0723  TempSrc: Oral                 Millie Shorb

## 2015-10-27 NOTE — Anesthesia Preprocedure Evaluation (Signed)
Anesthesia Evaluation  Patient identified by MRN, date of birth, ID band Patient awake    Reviewed: Allergy & Precautions, NPO status , Patient's Chart, lab work & pertinent test results, reviewed documented beta blocker date and time   Airway Mallampati: I  TM Distance: >3 FB     Dental  (+) Teeth Intact, Caps,    Pulmonary neg pulmonary ROS,  Healed old trach scar.   breath sounds clear to auscultation       Cardiovascular hypertension, Pt. on medications and Pt. on home beta blockers  Rhythm:Regular Rate:Normal     Neuro/Psych PSYCHIATRIC DISORDERS (hx severe depression with suicide attempt.) Depression    GI/Hepatic GERD  ,  Endo/Other  diabetes  Renal/GU      Musculoskeletal   Abdominal   Peds  Hematology   Anesthesia Other Findings   Reproductive/Obstetrics                             Anesthesia Physical Anesthesia Plan  ASA: III  Anesthesia Plan: MAC   Post-op Pain Management:    Induction: Intravenous  Airway Management Planned: Simple Face Mask  Additional Equipment:   Intra-op Plan:   Post-operative Plan:   Informed Consent: I have reviewed the patients History and Physical, chart, labs and discussed the procedure including the risks, benefits and alternatives for the proposed anesthesia with the patient or authorized representative who has indicated his/her understanding and acceptance.     Plan Discussed with:   Anesthesia Plan Comments:         Anesthesia Quick Evaluation

## 2015-10-27 NOTE — H&P (Signed)
Savannah Miles is an 60 y.o. female.   Chief Complaint: Patient is here for colonoscopy. HPI: She is 60 year old Caucasian female who is here for screening colonoscopy. Last exam was over 10 years ago. She denies abdominal pain change in bowel habits or rectal bleeding.  Patient states her colon is tortuous. Family history is negative for CRC.   Echo results from 2009 noted. She had normal LV size with hyperdynamic left ventricular systolic function with moderate concentric LVH. Mitral tricuspid Monnig and aortic valve are normal.  Past Medical History:  Diagnosis Date  . Arthritis   . Borderline glaucoma   . 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 Cancer [facility])    breast cancer  . Depression   . Diabetes mellitus, type II (HCC)   . GERD (gastroesophageal reflux disease)   . Heart murmur   . Hyperlipidemia   . Hypertension   . Kidney disease   . Osteoporosis     Past Surgical History:  Procedure Laterality Date  . BREAST LUMPECTOMY Left   . DILATION AND CURETTAGE OF UTERUS    . ORIF FOOT FRACTURE Left   . OTHER SURGICAL HISTORY Left Leg, Arm, Hip fx   otif, has rods and screws in each.  . SPINAL FUSION    . TRACHEOSTOMY      Family History  Problem Relation Age of Onset  . Cancer Mother   . Cancer Father   . Diabetes Sister   . Cancer Maternal Grandfather   . Cancer Paternal Grandmother    Social History:  reports that she has never smoked. She has never used smokeless tobacco. She reports that she does not drink alcohol or use drugs.  Allergies: No Known Allergies  Medications Prior to Admission  Medication Sig Dispense Refill  . atorvastatin (LIPITOR) 20 MG tablet Take 20 mg by mouth daily.    Marland Kitchen buPROPion (WELLBUTRIN) 75 MG tablet Take 75 mg by mouth daily.    . citalopram (CELEXA) 40 MG tablet Take 40 mg by mouth daily.    Marland Kitchen gabapentin (NEURONTIN) 800 MG tablet Take 800 mg by mouth. 5 x daily    . hydrochlorothiazide (HYDRODIURIL) 25 MG tablet Take 25 mg by mouth  daily.    Marland Kitchen HYDROcodone-acetaminophen (NORCO/VICODIN) 5-325 MG tablet Take 1 tablet by mouth every 6 (six) hours as needed for moderate pain.    Marland Kitchen insulin aspart (NOVOLOG) 100 UNIT/ML injection Inject into the skin 3 (three) times daily before meals. Per sliding scale.    . insulin detemir (LEVEMIR) 100 UNIT/ML injection Inject 60 Units into the skin at bedtime.    Marland Kitchen losartan (COZAAR) 100 MG tablet Take 100 mg by mouth daily.    . metFORMIN (GLUCOPHAGE) 1000 MG tablet Take 1,000 mg by mouth 2 (two) times daily with a meal.    . metoprolol tartrate (LOPRESSOR) 25 MG tablet Take 25 mg by mouth 2 (two) times daily.    Marland Kitchen omeprazole (PRILOSEC) 40 MG capsule Take 40 mg by mouth daily.    . polyethylene glycol-electrolytes (TRILYTE) 420 g solution Take 4,000 mLs by mouth once. 4000 mL 0  . Vitamin D, Ergocalciferol, (DRISDOL) 50000 units CAPS capsule Take 50,000 Units by mouth. Twice a week.      Results for orders placed or performed during the hospital encounter of 10/27/15 (from the past 48 hour(s))  Glucose, capillary     Status: Abnormal   Collection Time: 10/27/15  7:33 AM  Result Value Ref Range  Glucose-Capillary 175 (H) 65 - 99 mg/dL   No results found.  ROS  Blood pressure (!) 159/74, temperature 98.5 F (36.9 C), temperature source Oral, resp. rate (!) 23, SpO2 100 %. Physical Exam  Constitutional: She appears well-developed and well-nourished.  HENT:  Mouth/Throat: Oropharynx is clear and moist.  Eyes: Conjunctivae are normal. No scleral icterus.  Neck: No thyromegaly present.  Cardiovascular: Normal rate and regular rhythm.   Murmur (grade 3/6 holosystolic murmur heard all over the precordium.) heard. Respiratory: Effort normal and breath sounds normal.  GI: She exhibits no distension. There is no tenderness.  Musculoskeletal: She exhibits no edema.  Lymphadenopathy:    She has no cervical adenopathy.  Neurological: She is alert.  Skin: Skin is warm and dry.      Assessment/Plan Average risk screening colonoscopy under monitored anesthesia care.  Hildred Laser, MD 10/27/2015, 8:39 AM

## 2015-11-02 ENCOUNTER — Encounter (HOSPITAL_COMMUNITY): Payer: Self-pay | Admitting: Internal Medicine

## 2015-11-06 ENCOUNTER — Other Ambulatory Visit (INDEPENDENT_AMBULATORY_CARE_PROVIDER_SITE_OTHER): Payer: Self-pay | Admitting: *Deleted

## 2015-11-06 ENCOUNTER — Telehealth (INDEPENDENT_AMBULATORY_CARE_PROVIDER_SITE_OTHER): Payer: Self-pay | Admitting: Internal Medicine

## 2015-11-06 DIAGNOSIS — D72819 Decreased white blood cell count, unspecified: Secondary | ICD-10-CM

## 2015-11-06 DIAGNOSIS — D696 Thrombocytopenia, unspecified: Secondary | ICD-10-CM

## 2015-11-06 DIAGNOSIS — K760 Fatty (change of) liver, not elsewhere classified: Secondary | ICD-10-CM

## 2015-11-06 NOTE — Telephone Encounter (Signed)
Patient called, stated she missed Dr. Olevia Perches call Saturday.  She would like him to call her back.  [phone] or [phone]

## 2015-11-06 NOTE — Telephone Encounter (Signed)
Dr.Rehman was made aware. He states that he has called a few times, he plans to call her with her results.

## 2015-11-09 ENCOUNTER — Ambulatory Visit (HOSPITAL_COMMUNITY)
Admission: RE | Admit: 2015-11-09 | Discharge: 2015-11-09 | Disposition: A | Discharge status: Home / Self Care | Payer: Medicare PPO | Source: Ambulatory Visit | Attending: Internal Medicine | Admitting: Internal Medicine

## 2015-11-09 ENCOUNTER — Ambulatory Visit (HOSPITAL_COMMUNITY): Admission: RE | Admit: 2015-11-09 | Payer: Medicare PPO | Source: Ambulatory Visit

## 2015-11-09 DIAGNOSIS — D696 Thrombocytopenia, unspecified: Secondary | ICD-10-CM | POA: Diagnosis present

## 2015-11-09 DIAGNOSIS — K7460 Unspecified cirrhosis of liver: Secondary | ICD-10-CM | POA: Insufficient documentation

## 2015-11-09 DIAGNOSIS — R161 Splenomegaly, not elsewhere classified: Secondary | ICD-10-CM | POA: Diagnosis not present

## 2015-11-09 DIAGNOSIS — K760 Fatty (change of) liver, not elsewhere classified: Secondary | ICD-10-CM | POA: Diagnosis present

## 2015-11-09 DIAGNOSIS — K8020 Calculus of gallbladder without cholecystitis without obstruction: Secondary | ICD-10-CM | POA: Insufficient documentation

## 2015-11-09 DIAGNOSIS — D72819 Decreased white blood cell count, unspecified: Secondary | ICD-10-CM | POA: Insufficient documentation

## 2015-11-21 ENCOUNTER — Ambulatory Visit (INDEPENDENT_AMBULATORY_CARE_PROVIDER_SITE_OTHER): Payer: Medicare PPO | Admitting: Internal Medicine

## 2015-11-21 ENCOUNTER — Encounter (INDEPENDENT_AMBULATORY_CARE_PROVIDER_SITE_OTHER): Payer: Self-pay | Admitting: Internal Medicine

## 2015-11-21 VITALS — BP 130/80 | HR 66 | Temp 97.9°F | Resp 18 | Ht 67.0 in | Wt 249.5 lb

## 2015-11-21 DIAGNOSIS — E8770 Fluid overload, unspecified: Secondary | ICD-10-CM

## 2015-11-21 DIAGNOSIS — K7469 Other cirrhosis of liver: Secondary | ICD-10-CM | POA: Diagnosis not present

## 2015-11-21 DIAGNOSIS — K760 Fatty (change of) liver, not elsewhere classified: Secondary | ICD-10-CM

## 2015-11-21 MED ORDER — FUROSEMIDE 40 MG PO TABS: 40.0000 mg | ORAL_TABLET | Freq: Every day | ORAL | 1 refills | Status: DC

## 2015-11-21 MED ORDER — POTASSIUM CHLORIDE CRYS ER 20 MEQ PO TBCR: 20.0000 meq | EXTENDED_RELEASE_TABLET | Freq: Every day | ORAL | 1 refills | Status: AC

## 2015-11-21 NOTE — Progress Notes (Signed)
Presenting complaint;  Patient is here to discuss results of ultrasound which reveals changes of cirrhosis.  Database and Subjective:  Patient is 60 year old Caucasian female who was noted to have thrombocytopenia when she had preop blood work prior to colonoscopy. She was therefore advised to return for upper abdominal ultrasound which reveals changes of cirrhosis and portal hypertension. Patient denies history of jaundice or hepatitis. She has never been told that she has liver problems. She did see blood transfusion twice. Initially was in 1999 and more recently in 2012. There is no history of IV drug use or tattoos. Family history is negative for chronic liver disease. She has never been vaccinated for hepatitis A and B. She says she has never weighed more than 249 pounds. She says last A1c was 9.2. She goes to [facility] 2-3 times a week for pool therapy. She has chronic back pain. She states she has an appointment for an echocardiogram and blood work in near future.   Current Medications: Outpatient Encounter Prescriptions as of 11/21/2015  Medication Sig  . albuterol (PROVENTIL HFA;VENTOLIN HFA) 108 (90 Base) MCG/ACT inhaler Inhale 1-2 puffs into the lungs as needed for wheezing or shortness of breath.  Marland Kitchen atorvastatin (LIPITOR) 20 MG tablet Take 20 mg by mouth daily.  Marland Kitchen buPROPion (WELLBUTRIN) 75 MG tablet Take 75 mg by mouth daily.  . citalopram (CELEXA) 40 MG tablet Take 40 mg by mouth daily.  Marland Kitchen denosumab (PROLIA) 60 MG/ML SOLN injection Inject 60 mg into the skin. Administer in upper arm, thigh, or abdomen  Patient states that she gets twice a year.  . gabapentin (NEURONTIN) 800 MG tablet Take 800 mg by mouth. 5 x daily  . hydrochlorothiazide (HYDRODIURIL) 25 MG tablet Take 25 mg by mouth daily.  Marland Kitchen HYDROcodone-acetaminophen (NORCO/VICODIN) 5-325 MG tablet Take 1 tablet by mouth every 6 (six) hours as needed for moderate pain.  Marland Kitchen insulin aspart (NOVOLOG) 100 UNIT/ML injection Inject  into the skin 3 (three) times daily before meals. Per sliding scale.  . insulin detemir (LEVEMIR) 100 UNIT/ML injection Inject 60 Units into the skin at bedtime.  Marland Kitchen losartan (COZAAR) 100 MG tablet Take 100 mg by mouth daily.  . metFORMIN (GLUCOPHAGE) 1000 MG tablet Take 1,000 mg by mouth 2 (two) times daily with a meal.  . metoprolol tartrate (LOPRESSOR) 25 MG tablet Take 25 mg by mouth 2 (two) times daily.  Marland Kitchen omeprazole (PRILOSEC) 40 MG capsule Take 40 mg by mouth daily.  . Vitamin D, Ergocalciferol, (DRISDOL) 50000 units CAPS capsule Take 50,000 Units by mouth. Twice a week.   No facility-administered encounter medications on file as of 11/21/2015.      Objective: Blood pressure 130/80, pulse 66, temperature 97.9 F (36.6 C), temperature source Oral, resp. rate 18, height 5\' 7"  (1.702 m), weight 249 lb 8 oz (113.2 kg). Patient is alert and in no acute distress. Conjunctiva is pink. Sclera is nonicteric Oropharyngeal mucosa is normal. No neck masses or thyromegaly noted. Cardiac exam with regular rhythm normal S1 and S2. She has loud systolic ejection murmur best heard at left sternal border. Lungs are clear to auscultation. Abdomen is obese but soft and nontender without organomegaly or masses. If doing dullness is absent. She has 1+ pitting edema involving both legs slightly more on left side.  Labs/studies Results: Ultrasound from 2015/11/26  Liver is echogenic with irregular surface consistent with cirrhosis. Splenomegaly.  Cholelithiasis and  nonobstructing left renal stone. No evidence of ascites.     Assessment:  #1.  Cirrhosis. This is a new diagnosis. 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 #2. Fluid overload. Fluid overload does not appear to be secondary to cirrhosis. It may be related to diastolic dysfunction. Will change her diuretic therapy. Patient will keep appointment with her cardiologist. If dyspnea worsens she  will report to emergency room.    Plan:  Patient advised not to eat raw fish. She will get hepatitis A and B vaccination via PCP's office. She will go to the lab for hepatitis B surface antigen, hepatitis C virus antibody, anti-smooth muscle antibody, ENA, AMA serum ferritin and AFP. Will also check LFTs. Discontinue HCTZ. Begin furosemide 40 mg by mouth daily. KCl 20 mg by mouth daily. She will need metabolic 7 within the next few weeks which she can have via PCPs or cardiologist's office. Patient will increase physical activity as tolerated. Office visit in 3 months. Will schedule for EGD to screen for esophageal varices after her next visit.

## 2015-11-21 NOTE — Patient Instructions (Signed)
Physician will call with results of blood tests when copy received. Report to emergency room if breathing does not improve with fluid medication.

## 2015-11-27 ENCOUNTER — Ambulatory Visit: Payer: Medicare PPO | Admitting: "Endocrinology

## 2016-02-27 ENCOUNTER — Encounter (INDEPENDENT_AMBULATORY_CARE_PROVIDER_SITE_OTHER): Payer: Self-pay

## 2016-02-27 ENCOUNTER — Ambulatory Visit (INDEPENDENT_AMBULATORY_CARE_PROVIDER_SITE_OTHER): Payer: Medicare PPO | Admitting: Internal Medicine

## 2016-02-27 ENCOUNTER — Encounter (INDEPENDENT_AMBULATORY_CARE_PROVIDER_SITE_OTHER): Payer: Self-pay | Admitting: Internal Medicine

## 2016-02-27 VITALS — BP 130/78 | HR 72 | Temp 97.8°F | Resp 18 | Ht 67.0 in | Wt 232.0 lb

## 2016-02-27 DIAGNOSIS — K7460 Unspecified cirrhosis of liver: Secondary | ICD-10-CM | POA: Diagnosis not present

## 2016-02-27 NOTE — Progress Notes (Signed)
Presenting complaint;  Follow-up for cirrhosis.  Subjective:  Patient is 60 year old Caucasian female who was initially seen on 11/21/2015 for cirrhosis felt to be secondary to NASH. She was supposed to have blood work to rule out other causes of chronic liver disease she has not because she lost the paper. She did not call office. She says diabetic control has improved. She has appointment with Dr. Dorris Fetch next month and she will will have A1c. She is watching her diet. She has lost 17 pounds in the last 3 months. She has good appetite. She denies nausea vomiting heartburn or dysphagia. Bowels move every second or third day. She denies melena or rectal bleeding. She has occasional lower abdominal pain which is always relieved with defecation. She says heartburn is well controlled with therapy. Since her last visit she has had bilateral eye surgery because of glaucoma. She is trying to walk some but it is difficult because of arthritis.   Current Medications: Outpatient Encounter Prescriptions as of 02/27/2016  Medication Sig  . albuterol (PROVENTIL HFA;VENTOLIN HFA) 108 (90 Base) MCG/ACT inhaler Inhale 1-2 puffs into the lungs as needed for wheezing or shortness of breath.  Marland Kitchen atorvastatin (LIPITOR) 20 MG tablet Take 20 mg by mouth daily.  Marland Kitchen buPROPion (WELLBUTRIN) 75 MG tablet Take 75 mg by mouth daily.  . citalopram (CELEXA) 40 MG tablet Take 40 mg by mouth daily.  . cyclobenzaprine (FLEXERIL) 5 MG tablet TAKE ONE TABLET BY MOUTH 4 TIMES A DAY  . denosumab (PROLIA) 60 MG/ML SOLN injection Inject 60 mg into the skin. Administer in upper arm, thigh, or abdomen  Patient states that she gets twice a year.  . gabapentin (NEURONTIN) 800 MG tablet Take 800 mg by mouth. 5 x daily  . HYDROcodone-acetaminophen (NORCO/VICODIN) 5-325 MG tablet Take 1 tablet by mouth every 6 (six) hours as needed for moderate pain.  Marland Kitchen insulin aspart (NOVOLOG) 100 UNIT/ML injection Inject into the skin 3 (three) times  daily before meals. Per sliding scale.  . insulin detemir (LEVEMIR) 100 UNIT/ML injection Inject 60 Units into the skin at bedtime.  Marland Kitchen losartan (COZAAR) 100 MG tablet Take 100 mg by mouth daily.  . metFORMIN (GLUCOPHAGE) 1000 MG tablet Take 1,000 mg by mouth 2 (two) times daily with a meal.  . metoprolol tartrate (LOPRESSOR) 25 MG tablet Take 50 mg by mouth 2 (two) times daily.   Marland Kitchen omeprazole (PRILOSEC) 40 MG capsule Take 40 mg by mouth daily.  . penicillin v potassium (VEETID) 500 MG tablet TAKE 1 TABLET BY MOUTH EVERY 6 HOURS UNTIL GONE  . potassium chloride SA (K-DUR,KLOR-CON) 20 MEQ tablet Take 1 tablet (20 mEq total) by mouth daily.  Marland Kitchen torsemide (DEMADEX) 20 MG tablet Take 60 mg by mouth daily.   . Vitamin D, Ergocalciferol, (DRISDOL) 50000 units CAPS capsule Take 50,000 Units by mouth. Twice a week.  . [DISCONTINUED] furosemide (LASIX) 40 MG tablet Take 1 tablet (40 mg total) by mouth daily. (Patient not taking: Reported on 02/27/2016)   No facility-administered encounter medications on file as of 02/27/2016.      Objective: Blood pressure 130/78, pulse 72, temperature 97.8 F (36.6 C), temperature source Oral, resp. rate 18, height 5\' 7"  (1.702 m), weight 232 lb (105.2 kg). Patient is alert and in no acute distress. She does not have asterixis. Conjunctiva is pink. Sclera is nonicteric Oropharyngeal mucosa is normal. No neck masses or thyromegaly noted. Cardiac exam with regular rhythm normal S1 and S2. Grade 3/6 systolic ejection murmur  noted at left sternal border. Lungs are clear to auscultation. Abdomen is full but soft and nontender without hepatosplenomegaly.  Trace edema noted around ankles.   Assessment:  #1. 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 Other etiologies need to be ruled out. She has well preserved hepatic function. However she has thrombocytopenia.   Plan:  Patient informed that she will need to get hepatitis A and B vaccination. She will contact Dr.  Myrle Sheng office. She will go to the lab force serum iron TIBC ferritin SMA ANA hepatitis C virus underbody hepatitis B surface antigen AFP LFTs and ANA. Will schedule patient for upper endoscopy and blood work completed. Office visit in 4 months.

## 2016-02-27 NOTE — Patient Instructions (Signed)
Physician will call with results of blood tests when completed. Please check with Dr. Vito Berger about getting hepatitis A and B vaccination. Will schedule upper endoscopy after blood work completed.

## 2016-02-29 LAB — HEMOGLOBIN A1C: Hemoglobin A1C: 12.3

## 2016-02-29 LAB — LIPID PANEL
CHOLESTEROL: 161 mg/dL (ref 0–200)
HDL: 57 mg/dL (ref 35–70)
LDL Cholesterol: 72 mg/dL
TRIGLYCERIDES: 161 mg/dL — AB (ref 40–160)

## 2016-04-24 ENCOUNTER — Ambulatory Visit (INDEPENDENT_AMBULATORY_CARE_PROVIDER_SITE_OTHER): Payer: Medicare PPO | Admitting: "Endocrinology

## 2016-04-24 ENCOUNTER — Encounter: Payer: Self-pay | Admitting: "Endocrinology

## 2016-04-24 VITALS — BP 152/66 | HR 62 | Ht 67.0 in | Wt 236.0 lb

## 2016-04-24 DIAGNOSIS — E118 Type 2 diabetes mellitus with unspecified complications: Secondary | ICD-10-CM

## 2016-04-24 DIAGNOSIS — Z91199 Patient's noncompliance with other medical treatment and regimen due to unspecified reason: Secondary | ICD-10-CM | POA: Insufficient documentation

## 2016-04-24 DIAGNOSIS — I10 Essential (primary) hypertension: Secondary | ICD-10-CM

## 2016-04-24 DIAGNOSIS — E1165 Type 2 diabetes mellitus with hyperglycemia: Secondary | ICD-10-CM

## 2016-04-24 DIAGNOSIS — E782 Mixed hyperlipidemia: Secondary | ICD-10-CM | POA: Diagnosis not present

## 2016-04-24 DIAGNOSIS — Z794 Long term (current) use of insulin: Secondary | ICD-10-CM

## 2016-04-24 DIAGNOSIS — Z9119 Patient's noncompliance with other medical treatment and regimen: Secondary | ICD-10-CM | POA: Diagnosis not present

## 2016-04-24 DIAGNOSIS — IMO0002 Reserved for concepts with insufficient information to code with codable children: Secondary | ICD-10-CM

## 2016-04-24 MED ORDER — METFORMIN HCL 500 MG PO TABS: 500.0000 mg | ORAL_TABLET | Freq: Two times a day (BID) | ORAL | 2 refills | Status: DC

## 2016-04-24 NOTE — Progress Notes (Signed)
Subjective:    Patient ID: Savannah Miles, female    DOB: October 29, 1955. Patient is being seen in Follow-up for management of diabetes requested by  Kennieth Rad, MD  Past Medical History:  Diagnosis Date  . Arthritis   . Borderline glaucoma   . 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 Cancer [facility])    breast cancer  . Depression   . Diabetes mellitus, type II ([place])   . GERD (gastroesophageal reflux disease)   . Heart murmur   . Hyperlipidemia   . Hypertension   . Kidney disease   . Osteoporosis    Past Surgical History:  Procedure Laterality Date  . BREAST LUMPECTOMY Left   . COLONOSCOPY WITH PROPOFOL N/A 10/27/2015   Procedure: COLONOSCOPY WITH PROPOFOL;  Surgeon: Rogene Houston, MD;  Location: [facility];  Service: Endoscopy;  Laterality: N/A;  100 - moved to 8:30  . DILATION AND CURETTAGE OF UTERUS    . ORIF FOOT FRACTURE Left   . OTHER SURGICAL HISTORY Left Leg, Arm, Hip fx   otif, has rods and screws in each.  Marland Kitchen POLYPECTOMY  10/27/2015   Procedure: POLYPECTOMY;  Surgeon: Rogene Houston, MD;  Location: [facility];  Service: Endoscopy;;  . SPINAL FUSION    . TRACHEOSTOMY     Social History   Social History  . Marital status: Divorced    Spouse name: N/A  . Number of children: N/A  . Years of education: N/A   Social History Main Topics  . Smoking status: Never Smoker  . Smokeless tobacco: Never Used  . Alcohol use No  . Drug use: No  . Sexual activity: Yes    Birth control/ protection: Post-menopausal   Other Topics Concern  . None   Social History Narrative  . None   Outpatient Encounter Prescriptions as of 04/24/2016  Medication Sig  . albuterol (PROVENTIL HFA;VENTOLIN HFA) 108 (90 Base) MCG/ACT inhaler Inhale 1-2 puffs into the lungs as needed for wheezing or shortness of breath.  Marland Kitchen atorvastatin (LIPITOR) 20 MG tablet Take 20 mg by mouth daily.  Marland Kitchen buPROPion (WELLBUTRIN) 75 MG tablet Take 75 mg by mouth daily.  . citalopram (CELEXA) 40 MG tablet Take 40  mg by mouth daily.  . cyclobenzaprine (FLEXERIL) 5 MG tablet TAKE ONE TABLET BY MOUTH 4 TIMES A DAY  . denosumab (PROLIA) 60 MG/ML SOLN injection Inject 60 mg into the skin. Administer in upper arm, thigh, or abdomen  Patient states that she gets twice a year.  . gabapentin (NEURONTIN) 800 MG tablet Take 800 mg by mouth. 5 x daily  . HYDROcodone-acetaminophen (NORCO/VICODIN) 5-325 MG tablet Take 1 tablet by mouth every 6 (six) hours as needed for moderate pain.  Marland Kitchen insulin aspart (NOVOLOG) 100 UNIT/ML injection Inject into the skin 3 (three) times daily before meals. Per sliding scale.  . insulin detemir (LEVEMIR) 100 UNIT/ML injection Inject 70 Units into the skin at bedtime.   Marland Kitchen losartan (COZAAR) 100 MG tablet Take 100 mg by mouth daily.  . metFORMIN (GLUCOPHAGE) 500 MG tablet Take 1 tablet (500 mg total) by mouth 2 (two) times daily with a meal.  . metoprolol tartrate (LOPRESSOR) 25 MG tablet Take 50 mg by mouth 2 (two) times daily.   Marland Kitchen omeprazole (PRILOSEC) 40 MG capsule Take 40 mg by mouth daily.  . penicillin v potassium (VEETID) 500 MG tablet TAKE 1 TABLET BY MOUTH EVERY 6 HOURS UNTIL GONE  . potassium chloride SA (  K-DUR,KLOR-CON) 20 MEQ tablet Take 1 tablet (20 mEq total) by mouth daily.  Marland Kitchen torsemide (DEMADEX) 20 MG tablet Take 60 mg by mouth daily.   . Vitamin D, Ergocalciferol, (DRISDOL) 50000 units CAPS capsule Take 50,000 Units by mouth. Twice a week.  . [DISCONTINUED] metFORMIN (GLUCOPHAGE) 1000 MG tablet Take 500 mg by mouth 2 (two) times daily with a meal.   No facility-administered encounter medications on file as of 04/24/2016.    ALLERGIES: Allergies  Allergen Reactions  . Codeine     unknown   VACCINATION STATUS:  There is no immunization history on file for this patient.  Diabetes  She presents for her follow-up diabetic visit. She has type 2 diabetes mellitus. Onset time: Patient was diagnosed at approximate age of 76 years. Her disease course has been worsening.  There are no hypoglycemic associated symptoms. Pertinent negatives for hypoglycemia include no confusion, headaches, pallor or seizures. Associated symptoms include fatigue and polydipsia. Pertinent negatives for diabetes include no chest pain, no polyphagia, no polyuria and no weight loss. There are no hypoglycemic complications. Symptoms are worsening. There are no diabetic complications. Risk factors for coronary artery disease include diabetes mellitus, dyslipidemia, hypertension and sedentary lifestyle. Current diabetic treatment includes insulin injections and oral agent (monotherapy) (She is on toujeo 38 units in the morning and 68 units at bedtime, Humalog 28 units 3 times a day with meals. She is also on metformin 1000 mg by mouth twice a day.). Her weight is increasing steadily. She is following a generally unhealthy diet. She has not had a previous visit with a dietitian. She participates in exercise intermittently. Her overall blood glucose range is >200 mg/dl. (Unfortunately this patient did not follow monitoring and insulin injection recommendations. She has randomly tested 10 times in the last 14 days average is 351. Her A1c is higher at 12.3% increasing from 9.2% last visit. She missed her appointments since May 2017.) An ACE inhibitor/angiotensin II receptor blocker is being taken.  Hyperlipidemia  This is a chronic problem. The current episode started more than 1 year ago. Pertinent negatives include no chest pain, myalgias or shortness of breath. Current antihyperlipidemic treatment includes statins. Risk factors for coronary artery disease include diabetes mellitus, dyslipidemia, hypertension, obesity and a sedentary lifestyle.  Hypertension  This is a chronic problem. The current episode started more than 1 year ago. Pertinent negatives include no chest pain, headaches, palpitations or shortness of breath. Risk factors for coronary artery disease include diabetes mellitus, dyslipidemia,  obesity and sedentary lifestyle. Past treatments include angiotensin blockers.      Review of Systems  Constitutional: Positive for fatigue. Negative for chills, fever, unexpected weight change and weight loss.  HENT: Negative for trouble swallowing and voice change.   Eyes: Negative for visual disturbance.  Respiratory: Negative for cough, shortness of breath and wheezing.   Cardiovascular: Negative for chest pain, palpitations and leg swelling.  Gastrointestinal: Negative for diarrhea, nausea and vomiting.  Endocrine: Positive for polydipsia. Negative for cold intolerance, heat intolerance, polyphagia and polyuria.  Musculoskeletal: Negative for arthralgias and myalgias.  Skin: Negative for color change, pallor, rash and wound.  Neurological: Negative for seizures and headaches.  Psychiatric/Behavioral: Negative for confusion and suicidal ideas.    Objective:    BP (!) 152/66   Pulse 62   Ht 5\' 7"  (1.702 m)   Wt 236 lb (107 kg)   BMI 36.96 kg/m   Wt Readings from Last 3 Encounters:  04/24/16 236 lb (107 kg)  02/27/16  232 lb (105.2 kg)  11/21/15 249 lb 8 oz (113.2 kg)    Physical Exam  Constitutional: She is oriented to person, place, and time. She appears well-developed.  HENT:  Head: Normocephalic and atraumatic.  Eyes: EOM are normal.  Neck: Normal range of motion. Neck supple. No tracheal deviation present. No thyromegaly present.  Cardiovascular: Normal rate and regular rhythm.   Murmur heard. She has systolic murmur which radiates to the left side of the neck. She is following with cardiology had echocardiogram in the last 2 years. Records not available to review. I advised her to continue follow up closely with cardiologist.   Pulmonary/Chest: Effort normal and breath sounds normal.  Abdominal: Soft. Bowel sounds are normal. There is no tenderness. There is no guarding.  Musculoskeletal: Normal range of motion. She exhibits no edema.  Neurological: She is alert  and oriented to person, place, and time. She has normal reflexes. No cranial nerve deficit. Coordination normal.  Skin: Skin is warm and dry. No rash noted. No erythema. No pallor.  Psychiatric: She has a normal mood and affect. Judgment normal.     Diabetic Labs (most recent): Lab Results  Component Value Date   HGBA1C 12.3 02/29/2016   HGBA1C 9.2 07/13/2015   HGBA1C 11.6 04/19/2015    Labs from July 2016 included A1c of 8.5%, completely labs to be scanned into her records.   Assessment & Plan:   1. Uncontrolled type 2 diabetes mellitus with complication, with long-term current use of insulin ([place])  - Patient has currently uncontrolled symptomatic type 2 DM since  61 years of age. - She is alarmingly noncompliant. She missed her appointments since May 2017. She returns with increasing A1c of 12.3% from 9.2%. She has monitored randomly only 10 times in the last 14 days average in 351.. - Recent labs reviewed.   Her diabetes is complicated by obesity and patient remains at a high risk for more acute and chronic complications of diabetes which include CAD, CVA, CKD, retinopathy, and neuropathy. These are all discussed in detail with the patient.  - I have counseled the patient on diet management and weight loss, by adopting a carbohydrate restricted/protein rich diet.  - Suggestion is made for patient to avoid simple carbohydrates   from their diet including Cakes , Desserts, Ice Cream,  Soda (  diet and regular) , Sweet Tea , Candies,  Chips, Cookies, Artificial Sweeteners,   and "Sugar-free" Products . This will help patient to have stable blood glucose profile and potentially avoid unintended weight gain.  - I encouraged the patient to switch to  unprocessed or minimally processed complex starch and increased protein intake (animal or plant source), fruits, and vegetables.  - Patient is advised to stick to a routine mealtimes to eat 3 meals  a day and avoid unnecessary snacks ( to  snack only to correct hypoglycemia).  - The patient will be scheduled with Jearld Fenton, RDN, CDE for individualized DM education.  - I have approached patient with the following individualized plan to manage diabetes and patient agrees:    I  urged her to resume basal bolus insulin.  - She states she has some supplies of Levemir at home instead of Toujeo and NovoLog instead of Humalog . - I advised her to continue Levemir 70 units daily at bedtime, NovoLog 10 units 3 times a day before meals  for pre-meal BG readings of 90-150mg /dl, plus patient specific correction dose for unexpected hyperglycemia above 150mg /dl, associated with  strict monitoring of glucose  AC and HS. - She will be considered for Novolin 70/30 if she cannot afford insulin analogs. - Patient is warned not to take insulin without proper monitoring per orders. -Adjustment parameters are given for hypo and hyperglycemia in writing. -Patient is encouraged to call clinic for blood glucose levels less than 70 or above 300 mg /dl. - I will lower metformin to 500 mg by mouth twice a day, therapeutically suitable for patient.  - Patient will be considered for incretin therapy as appropriate next visit. - Patient specific target  A1c;  LDL, HDL, Triglycerides, and  Waist Circumference were discussed in detail.  2) BP/HTN: uncontrolled. Continue current medications including ACEI/ARB. 3) Lipids/HPL: Controlled LDL 72, continue statins. 4)  Weight/Diet:   patient is obese, CDE Consult has been initiated , exercise, and detailed carbohydrates information provided.  5) Chronic Care/Health Maintenance:  -Patient is on ACEI/ARB and Statin medications and encouraged to continue to follow up with Ophthalmology, Podiatrist at least yearly or according to recommendations, and advised to   stay away from smoking. I have recommended yearly flu vaccine and pneumonia vaccination at least every 5 years; moderate intensity exercise for up to 150  minutes weekly; and  sleep for at least 7 hours a day.  - 25 minutes of time was spent on the care of this patient , 50% of which was applied for counseling on diabetes complications and their preventions. -Given her complaint of fatigue and history of systolic murmur suggestive of valvular insufficiency, I advised her to call and follow up with her cardiologist. - Patient to bring meter and  blood glucose logs during their next visit.   - I advised patient to maintain close follow up with Kennieth Rad, MD for primary care needs.  Follow up plan: - Return in about 1 week (around 05/01/2016) for follow up with meter and logs- no labs.  Glade Lloyd, MD Phone: [phone]  Fax: [phone]   04/24/2016, 10:17 AM

## 2016-04-24 NOTE — Patient Instructions (Signed)

## 2016-05-07 ENCOUNTER — Encounter: Payer: Self-pay | Admitting: "Endocrinology

## 2016-05-07 ENCOUNTER — Ambulatory Visit (INDEPENDENT_AMBULATORY_CARE_PROVIDER_SITE_OTHER): Payer: Medicare PPO | Admitting: "Endocrinology

## 2016-05-07 VITALS — BP 136/76 | HR 76 | Ht 67.0 in | Wt 236.0 lb

## 2016-05-07 DIAGNOSIS — E1165 Type 2 diabetes mellitus with hyperglycemia: Secondary | ICD-10-CM | POA: Diagnosis not present

## 2016-05-07 DIAGNOSIS — Z794 Long term (current) use of insulin: Secondary | ICD-10-CM | POA: Diagnosis not present

## 2016-05-07 DIAGNOSIS — Z9119 Patient's noncompliance with other medical treatment and regimen: Secondary | ICD-10-CM

## 2016-05-07 DIAGNOSIS — E782 Mixed hyperlipidemia: Secondary | ICD-10-CM

## 2016-05-07 DIAGNOSIS — Z91199 Patient's noncompliance with other medical treatment and regimen due to unspecified reason: Secondary | ICD-10-CM

## 2016-05-07 DIAGNOSIS — E118 Type 2 diabetes mellitus with unspecified complications: Secondary | ICD-10-CM

## 2016-05-07 DIAGNOSIS — I10 Essential (primary) hypertension: Secondary | ICD-10-CM | POA: Diagnosis not present

## 2016-05-07 DIAGNOSIS — IMO0002 Reserved for concepts with insufficient information to code with codable children: Secondary | ICD-10-CM

## 2016-05-07 MED ORDER — GLUCOSE BLOOD VI STRP: 1.0000 | ORAL_STRIP | Freq: Four times a day (QID) | 1 refills | Status: DC

## 2016-05-07 NOTE — Patient Instructions (Signed)

## 2016-05-07 NOTE — Progress Notes (Signed)
Subjective:    Patient ID: Savannah Miles, female    DOB: 1955/07/03. Patient is being seen in Follow-up for management of diabetes requested by  Kennieth Rad, MD  Past Medical History:  Diagnosis Date  . Arthritis   . Borderline glaucoma   . 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 Cancer [facility])    breast cancer  . Depression   . Diabetes mellitus, type II ([place])   . GERD (gastroesophageal reflux disease)   . Heart murmur   . Hyperlipidemia   . Hypertension   . Kidney disease   . Osteoporosis    Past Surgical History:  Procedure Laterality Date  . BREAST LUMPECTOMY Left   . COLONOSCOPY WITH PROPOFOL N/A 10/27/2015   Procedure: COLONOSCOPY WITH PROPOFOL;  Surgeon: Rogene Houston, MD;  Location: [facility];  Service: Endoscopy;  Laterality: N/A;  100 - moved to 8:30  . DILATION AND CURETTAGE OF UTERUS    . ORIF FOOT FRACTURE Left   . OTHER SURGICAL HISTORY Left Leg, Arm, Hip fx   otif, has rods and screws in each.  Marland Kitchen POLYPECTOMY  10/27/2015   Procedure: POLYPECTOMY;  Surgeon: Rogene Houston, MD;  Location: [facility];  Service: Endoscopy;;  . SPINAL FUSION    . TRACHEOSTOMY     Social History   Social History  . Marital status: Divorced    Spouse name: N/A  . Number of children: N/A  . Years of education: N/A   Social History Main Topics  . Smoking status: Never Smoker  . Smokeless tobacco: Never Used  . Alcohol use No  . Drug use: No  . Sexual activity: Yes    Birth control/ protection: Post-menopausal   Other Topics Concern  . None   Social History Narrative  . None   Outpatient Encounter Prescriptions as of 05/07/2016  Medication Sig  . glucose blood (ACCU-CHEK GUIDE) test strip 1 each by Other route 4 (four) times daily. Use as instructed. E11.65  . [DISCONTINUED] glucose blood (ACCU-CHEK GUIDE) test strip 1 each by Other route 4 (four) times daily. Use as instructed. E11.65  . albuterol (PROVENTIL HFA;VENTOLIN HFA) 108 (90 Base) MCG/ACT inhaler Inhale  1-2 puffs into the lungs as needed for wheezing or shortness of breath.  Marland Kitchen atorvastatin (LIPITOR) 20 MG tablet Take 20 mg by mouth daily.  Marland Kitchen buPROPion (WELLBUTRIN) 75 MG tablet Take 75 mg by mouth daily.  . citalopram (CELEXA) 40 MG tablet Take 40 mg by mouth daily.  . cyclobenzaprine (FLEXERIL) 5 MG tablet TAKE ONE TABLET BY MOUTH 4 TIMES A DAY  . denosumab (PROLIA) 60 MG/ML SOLN injection Inject 60 mg into the skin. Administer in upper arm, thigh, or abdomen  Patient states that she gets twice a year.  . gabapentin (NEURONTIN) 800 MG tablet Take 800 mg by mouth. 5 x daily  . HYDROcodone-acetaminophen (NORCO/VICODIN) 5-325 MG tablet Take 1 tablet by mouth every 6 (six) hours as needed for moderate pain.  Marland Kitchen insulin aspart (NOVOLOG) 100 UNIT/ML injection Inject 15-21 Units into the skin 3 (three) times daily before meals.  . insulin detemir (LEVEMIR) 100 UNIT/ML injection Inject 80 Units into the skin at bedtime.  Marland Kitchen losartan (COZAAR) 100 MG tablet Take 100 mg by mouth daily.  . metFORMIN (GLUCOPHAGE) 500 MG tablet Take 1 tablet (500 mg total) by mouth 2 (two) times daily with a meal.  . metoprolol tartrate (LOPRESSOR) 25 MG tablet Take 50 mg by mouth 2 (  two) times daily.   Marland Kitchen omeprazole (PRILOSEC) 40 MG capsule Take 40 mg by mouth daily.  . potassium chloride SA (K-DUR,KLOR-CON) 20 MEQ tablet Take 1 tablet (20 mEq total) by mouth daily.  Marland Kitchen torsemide (DEMADEX) 20 MG tablet Take 60 mg by mouth daily.   . Vitamin D, Ergocalciferol, (DRISDOL) 50000 units CAPS capsule Take 50,000 Units by mouth. Twice a week.  . [DISCONTINUED] penicillin v potassium (VEETID) 500 MG tablet TAKE 1 TABLET BY MOUTH EVERY 6 HOURS UNTIL GONE   No facility-administered encounter medications on file as of 05/07/2016.    ALLERGIES: Allergies  Allergen Reactions  . Codeine     unknown   VACCINATION STATUS:  There is no immunization history on file for this patient.  Diabetes  She presents for her follow-up diabetic  visit. She has type 2 diabetes mellitus. Onset time: Patient was diagnosed at approximate age of 65 years. Her disease course has been worsening. There are no hypoglycemic associated symptoms. Pertinent negatives for hypoglycemia include no confusion, headaches, pallor or seizures. Associated symptoms include fatigue and polydipsia. Pertinent negatives for diabetes include no chest pain, no polyphagia, no polyuria and no weight loss. There are no hypoglycemic complications. Symptoms are worsening. There are no diabetic complications. Risk factors for coronary artery disease include diabetes mellitus, dyslipidemia, hypertension and sedentary lifestyle. Current diabetic treatment includes insulin injections and oral agent (monotherapy) (She is on toujeo 38 units in the morning and 68 units at bedtime, Humalog 28 units 3 times a day with meals. She is also on metformin 1000 mg by mouth twice a day.). Her weight is increasing steadily. She is following a generally unhealthy diet. She has not had a previous visit with a dietitian. She participates in exercise intermittently. Her overall blood glucose range is >200 mg/dl. (- meter only shows 2 readings in the last 14 days. However she has a separate log showing significantly above target blood glucose readings- above 250 mg/dL averaging .  Her A1c before her last visit was  higher at 12.3% increasing from 9.2% last visit. ) An ACE inhibitor/angiotensin II receptor blocker is being taken.  Hyperlipidemia  This is a chronic problem. The current episode started more than 1 year ago. Pertinent negatives include no chest pain, myalgias or shortness of breath. Current antihyperlipidemic treatment includes statins. Risk factors for coronary artery disease include diabetes mellitus, dyslipidemia, hypertension, obesity and a sedentary lifestyle.  Hypertension  This is a chronic problem. The current episode started more than 1 year ago. Pertinent negatives include no chest  pain, headaches, palpitations or shortness of breath. Risk factors for coronary artery disease include diabetes mellitus, dyslipidemia, obesity and sedentary lifestyle. Past treatments include angiotensin blockers.      Review of Systems  Constitutional: Positive for fatigue. Negative for chills, fever, unexpected weight change and weight loss.  HENT: Negative for trouble swallowing and voice change.   Eyes: Negative for visual disturbance.  Respiratory: Negative for cough, shortness of breath and wheezing.   Cardiovascular: Negative for chest pain, palpitations and leg swelling.  Gastrointestinal: Negative for diarrhea, nausea and vomiting.  Endocrine: Positive for polydipsia. Negative for cold intolerance, heat intolerance, polyphagia and polyuria.  Musculoskeletal: Negative for arthralgias and myalgias.  Skin: Negative for color change, pallor, rash and wound.  Neurological: Negative for seizures and headaches.  Psychiatric/Behavioral: Negative for confusion and suicidal ideas.    Objective:    BP 136/76   Pulse 76   Ht 5\' 7"  (1.702 m)   Wt  236 lb (107 kg)   BMI 36.96 kg/m   Wt Readings from Last 3 Encounters:  05/07/16 236 lb (107 kg)  04/24/16 236 lb (107 kg)  02/27/16 232 lb (105.2 kg)    Physical Exam  Constitutional: She is oriented to person, place, and time. She appears well-developed.  HENT:  Head: Normocephalic and atraumatic.  Eyes: EOM are normal.  Neck: Normal range of motion. Neck supple. No tracheal deviation present. No thyromegaly present.  Cardiovascular: Normal rate and regular rhythm.   Murmur heard. She has systolic murmur which radiates to the left side of the neck. She is following with cardiology had echocardiogram in the last 2 years. Records not available to review. I advised her to continue follow up closely with cardiologist.   Pulmonary/Chest: Effort normal and breath sounds normal.  Abdominal: Soft. Bowel sounds are normal. There is no  tenderness. There is no guarding.  Musculoskeletal: Normal range of motion. She exhibits no edema.  Neurological: She is alert and oriented to person, place, and time. She has normal reflexes. No cranial nerve deficit. Coordination normal.  Skin: Skin is warm and dry. No rash noted. No erythema. No pallor.  Psychiatric: She has a normal mood and affect. Judgment normal.     Diabetic Labs (most recent): Lab Results  Component Value Date   HGBA1C 12.3 02/29/2016   HGBA1C 9.2 07/13/2015   HGBA1C 11.6 04/19/2015    Labs from July 2016 included A1c of 8.5%, completely labs to be scanned into her records.   Assessment & Plan:   1. Uncontrolled type 2 diabetes mellitus with complication, with long-term current use of insulin ([place])  - Patient has currently uncontrolled symptomatic type 2 DM since  61 years of age. - She is alarmingly noncompliant. She missed her appointments since May 2017. She returns with increasing A1c of 12.3% from 9.2%. She has monitored randomly only 10 times in the last 14 days average in 351.. - Recent labs reviewed.   Her diabetes is complicated by obesity and patient remains at a high risk for more acute and chronic complications of diabetes which include CAD, CVA, CKD, retinopathy, and neuropathy. These are all discussed in detail with the patient.  - I have counseled the patient on diet management and weight loss, by adopting a carbohydrate restricted/protein rich diet.  - Suggestion is made for patient to avoid simple carbohydrates   from their diet including Cakes , Desserts, Ice Cream,  Soda (  diet and regular) , Sweet Tea , Candies,  Chips, Cookies, Artificial Sweeteners,   and "Sugar-free" Products . This will help patient to have stable blood glucose profile and potentially avoid unintended weight gain.  - I encouraged the patient to switch to  unprocessed or minimally processed complex starch and increased protein intake (animal or plant source), fruits,  and vegetables.  - Patient is advised to stick to a routine mealtimes to eat 3 meals  a day and avoid unnecessary snacks ( to snack only to correct hypoglycemia).  - The patient will be scheduled with Jearld Fenton, RDN, CDE for individualized DM education.  - I have approached patient with the following individualized plan to manage diabetes and patient agrees:    I  urged her to resume  and continue basal/ bolus insulin.  - She states she has some supplies of Levemir at home. - I advised her to increase Levemir to 80 units daily at bedtime,  Increase NovoLog to 15 units 3 times a day  before meals  for pre-meal BG readings of 90-150mg /dl, plus patient specific correction dose for unexpected hyperglycemia above 150mg /dl, associated with strict monitoring of glucose  AC and HS. - She reports that her insurance will cover Tresiba in place of Levemir, and that's unacceptable option. - She will be considered for Novolin 70/30 if she cannot afford insulin analogs. - Patient is warned not to take insulin without proper monitoring per orders. -Adjustment parameters are given for hypo and hyperglycemia in writing. -Patient is encouraged to call clinic for blood glucose levels less than 70 or above 300 mg /dl. - I will continue metformin to 500 mg by mouth twice a day, therapeutically suitable for patient.  - Patient will be considered for incretin therapy as appropriate next visit. - Patient specific target  A1c;  LDL, HDL, Triglycerides, and  Waist Circumference were discussed in detail.  2) BP/HTN: uncontrolled. Continue current medications including ACEI/ARB. 3) Lipids/HPL: Controlled LDL 72, continue statins. 4)  Weight/Diet:   patient is obese, CDE Consult has been initiated , exercise, and detailed carbohydrates information provided.  5) Chronic Care/Health Maintenance:  -Patient is on ACEI/ARB and Statin medications and encouraged to continue to follow up with Ophthalmology, Podiatrist at  least yearly or according to recommendations, and advised to   stay away from smoking. I have recommended yearly flu vaccine and pneumonia vaccination at least every 5 years; moderate intensity exercise for up to 150 minutes weekly; and  sleep for at least 7 hours a day.  - 25 minutes of time was spent on the care of this patient , 50% of which was applied for counseling on diabetes complications and their preventions. -Given her complaint of fatigue and history of systolic murmur suggestive of valvular insufficiency, I advised her to call and follow up with her cardiologist. - Patient to bring meter and  blood glucose logs during their next visit.   - I advised patient to maintain close follow up with Kennieth Rad, MD for primary care needs.  Follow up plan: - Return in about 8 weeks (around 07/02/2016) for follow up with pre-visit labs, meter, and logs.  Glade Lloyd, MD Phone: [phone]  Fax: [phone]   05/07/2016, 10:53 AM

## 2016-06-04 ENCOUNTER — Telehealth: Payer: Self-pay | Admitting: "Endocrinology

## 2016-06-04 NOTE — Telephone Encounter (Signed)
Notified pt that her paper work was sent in but that we do not have any samples at this time. I told her to continue to call back and check

## 2016-06-04 NOTE — Telephone Encounter (Signed)
Patient requesting samples of her insulin. She cannot afford to purchase it. She is attempting to get patient assistance. Please call.

## 2016-06-25 ENCOUNTER — Encounter (INDEPENDENT_AMBULATORY_CARE_PROVIDER_SITE_OTHER): Payer: Self-pay

## 2016-06-25 ENCOUNTER — Encounter (INDEPENDENT_AMBULATORY_CARE_PROVIDER_SITE_OTHER): Payer: Self-pay | Admitting: Internal Medicine

## 2016-06-25 ENCOUNTER — Ambulatory Visit (INDEPENDENT_AMBULATORY_CARE_PROVIDER_SITE_OTHER): Payer: Medicare PPO | Admitting: Internal Medicine

## 2016-06-25 VITALS — BP 128/90 | HR 66 | Temp 98.2°F | Resp 18 | Ht 67.0 in | Wt 241.9 lb

## 2016-06-25 DIAGNOSIS — K219 Gastro-esophageal reflux disease without esophagitis: Secondary | ICD-10-CM | POA: Diagnosis not present

## 2016-06-25 DIAGNOSIS — K7460 Unspecified cirrhosis of liver: Secondary | ICD-10-CM | POA: Diagnosis not present

## 2016-06-25 MED ORDER — OMEPRAZOLE 20 MG PO CPDR: 20.0000 mg | DELAYED_RELEASE_CAPSULE | Freq: Every day | ORAL | 3 refills | Status: DC

## 2016-06-25 NOTE — Patient Instructions (Signed)
Physician will call with results of blood tests and ultrasound and completed. Esophagogastroduodenoscopy to be scheduled.

## 2016-06-25 NOTE — Progress Notes (Signed)
Presenting complaint;  Follow-up for chronic liver disease.  Database and Subjective:  Patient is 61 year old Caucasian female who was found to have thrombocytopenia last year on routine testing for colonoscopy. She therefore had upper abdominal ultrasound on 11/09/2015 which showed liver contour consistent with cirrhosis, splenomegaly and cholelithiasis. Iron studies ruled out hemochromatosis. Hepatitis B surface antigen and HCV antibody were negative. AFP was normal. ANA was positive at a low titer but smooth muscle antibody was negative. Antimitochondrial antibody was negative. 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 ANA elevation was felt to be nonspecific. He was advised to get a vaccination for hepatitis A and B. She has not yet received vaccination. She was told that she did not need vaccination. Between her first visit in August 2017 and second visit on 02/27/2016 she had lost 17 pounds.  Patient is here for scheduled visit. She has gained 9 pounds since her last visit. She believes weight gain is secondary to fluid retention. She states she had echocardiogram 2 weeks ago and was unchanged. [facility]. She is also seeing a nutritionist. She states heartburn is well controlled with therapy. She denies nausea vomiting dysphagia or abdominal pain melena or rectal bleeding. She takes pain medication twice daily. She is due for blood work next week    Current Medications: Outpatient Encounter Prescriptions as of 06/25/2016  Medication Sig  . albuterol (PROVENTIL HFA;VENTOLIN HFA) 108 (90 Base) MCG/ACT inhaler Inhale 1-2 puffs into the lungs as needed for wheezing or shortness of breath.  Marland Kitchen amLODipine (NORVASC) 5 MG tablet Take 10 mg by mouth daily.  Marland Kitchen atorvastatin (LIPITOR) 20 MG tablet Take 20 mg by mouth daily.  Marland Kitchen buPROPion (WELLBUTRIN) 75 MG tablet Take 75 mg by mouth daily.  . cephALEXin (KEFLEX) 500 MG capsule  TAKE 4 CAPSULES BY MOUTH 1 HOUR BEFORE DENTAL APPOINTMENT  . citalopram (CELEXA) 40 MG tablet Take 40 mg by mouth daily.  Marland Kitchen denosumab (PROLIA) 60 MG/ML SOLN injection Inject 60 mg into the skin. Administer in upper arm, thigh, or abdomen  Patient states that she gets twice a year.  . gabapentin (NEURONTIN) 800 MG tablet Take 800 mg by mouth. 5 x daily  . glucose blood (ACCU-CHEK GUIDE) test strip 1 each by Other route 4 (four) times daily. Use as instructed. E11.65  . HYDROcodone-acetaminophen (NORCO/VICODIN) 5-325 MG tablet Take 1 tablet by mouth every 6 (six) hours as needed for moderate pain.  Marland Kitchen insulin aspart (NOVOLOG) 100 UNIT/ML injection Inject 15-21 Units into the skin 3 (three) times daily before meals.  . insulin detemir (LEVEMIR) 100 UNIT/ML injection Inject 80 Units into the skin at bedtime.  Marland Kitchen losartan (COZAAR) 100 MG tablet Take 100 mg by mouth daily.  . metFORMIN (GLUCOPHAGE) 500 MG tablet Take 1 tablet (500 mg total) by mouth 2 (two) times daily with a meal.  . metoprolol tartrate (LOPRESSOR) 25 MG tablet Take 50 mg by mouth 2 (two) times daily.   . nitroGLYCERIN (NITROSTAT) 0.4 MG SL tablet USE 1 TAB SUBLINGUALLY EVERY 5 MINUTES FOR CHEST PAIN AS NEEDED  . omeprazole (PRILOSEC) 40 MG capsule Take 40 mg by mouth daily.  . potassium chloride SA (K-DUR,KLOR-CON) 20 MEQ tablet Take 1 tablet (20 mEq total) by mouth daily.  Marland Kitchen torsemide (DEMADEX) 20 MG tablet Take 60 mg by mouth daily.   . Vitamin D, Ergocalciferol, (DRISDOL) 50000 units CAPS capsule Take 50,000 Units by mouth. Twice a week.  . [  DISCONTINUED] cyclobenzaprine (FLEXERIL) 5 MG tablet TAKE ONE TABLET BY MOUTH 4 TIMES A DAY   No facility-administered encounter medications on file as of 06/25/2016.      Objective: Blood pressure 128/90, pulse 66, temperature 98.2 F (36.8 C), temperature source Oral, resp. rate 18, height 5\' 7"  (1.702 m), weight 241 lb 14.4 oz (109.7 kg). Patient is alert and in no acute distress. She  does not have asterixis. Conjunctiva is pink. Sclera is nonicteric Oropharyngeal mucosa is normal. No neck masses or thyromegaly noted. Cardiac exam with regular rhythm normal S1 and S2. Loud holosystolic murmur noted all over the precordium. It is best heard at left sternal border. Lungs are clear to auscultation. Abdomen is full. It is soft and nontender without organomegaly or masses. No LE edema or clubbing noted.   Assessment:  #1. 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 Patient remains with decompensated hepatic disease. She has increased physical activity. However she is not losing weight anymore. Weight gain may be due to insulin repeat for her diabetes mellitus. Optimal diabetic control would help with her liver disease. She has not been screened for esophageal varices. #2. GERD. She is doing well with PPI. Will try dose reduction.   Plan:  CBC, comprehensive chemistry panel and AFP with her next blood draw. Abdominal ultrasound. Esophagogastroduodenoscopy with possible esophageal variceal banding under monitored anesthesia care. Will ask Dr. Vito Berger give patient hepatitis a and B vaccination. Office visit in 6 months.

## 2016-06-26 ENCOUNTER — Encounter (INDEPENDENT_AMBULATORY_CARE_PROVIDER_SITE_OTHER): Payer: Self-pay | Admitting: *Deleted

## 2016-06-26 ENCOUNTER — Other Ambulatory Visit (INDEPENDENT_AMBULATORY_CARE_PROVIDER_SITE_OTHER): Payer: Self-pay | Admitting: *Deleted

## 2016-06-26 DIAGNOSIS — K219 Gastro-esophageal reflux disease without esophagitis: Secondary | ICD-10-CM | POA: Insufficient documentation

## 2016-06-26 DIAGNOSIS — I8500 Esophageal varices without bleeding: Secondary | ICD-10-CM

## 2016-06-26 DIAGNOSIS — K7469 Other cirrhosis of liver: Secondary | ICD-10-CM

## 2016-07-02 ENCOUNTER — Ambulatory Visit: Payer: Medicare PPO | Admitting: "Endocrinology

## 2016-07-03 LAB — HEMOGLOBIN A1C: HEMOGLOBIN A1C: 9.8

## 2016-07-03 NOTE — Patient Instructions (Signed)
Savannah Miles  07/03/2016     @PREFPERIOPPHARMACY @   Your procedure is scheduled on 07/02/2016.  Report to [facility] at 11:00 A.M.  Call this number if you have problems the morning of surgery:  [phone]   Remember:  Do not eat food or drink liquids after midnight.  Take these medicines the morning of surgery with A SIP OF WATER Albuterol inhaler and bring with you, Duobneb, Amlodipine, Wellbutrin, Celexa  Gabapentin, Hydrocodone if needed, Cozaar, Metoprolol, Prilosec  DO NOT TAKE DIABETIC MEDICATIONS MORNING OF PROCEDURE  TAKE ONLY 1/2 DOSE OF EVENING INSULIN NIGHT PRIOR TO PROCEDURE   Do not wear jewelry, make-up or nail polish.  Do not wear lotions, powders, or perfumes, or deoderant.  Do not shave 48 hours prior to surgery.  Men may shave face and neck.  Do not bring valuables to the hospital.  [facility] is not responsible for any belongings or valuables.  Contacts, dentures or bridgework may not be worn into surgery.  Leave your suitcase in the car.  After surgery it may be brought to your room.  For patients admitted to the hospital, discharge time will be determined by your treatment team.  Patients discharged the day of surgery will not be allowed to drive home.    Please read over the following fact sheets that you were given. Anesthesia Post-op Instructions    PATIENT INSTRUCTIONS POST-ANESTHESIA  IMMEDIATELY FOLLOWING SURGERY:  Do not drive or operate machinery for the first twenty four hours after surgery.  Do not make any important decisions for twenty four hours after surgery or while taking narcotic pain medications or sedatives.  If you develop intractable nausea and vomiting or a severe headache please notify your doctor immediately.  FOLLOW-UP:  Please make an appointment with your surgeon as instructed. You do not need to follow up with anesthesia unless specifically instructed to do so.  WOUND CARE INSTRUCTIONS (if applicable):  Keep a dry clean  dressing on the anesthesia/puncture wound site if there is drainage.  Once the wound has quit draining you may leave it open to air.  Generally you should leave the bandage intact for twenty four hours unless there is drainage.  If the epidural site drains for more than 36-48 hours please call the anesthesia department.  QUESTIONS?:  Please feel free to call your physician or the hospital operator if you have any questions, and they will be happy to assist you.      Esophageal Varices The esophagus is the passage that connects the throat to the stomach. Esophageal varices are blood vessels in the esophagus that have become enlarged. They develop when extra blood is forced to flow through them because the blood's normal pathway is blocked. Without treatment these blood vessels eventually break and bleed (hemorrhage). A hemorrhage is life-threatening. What are the causes? This condition may be caused by:  Scarring of the liver (cirrhosis) due to alcoholism. This is the most common cause.  Liver disease.  Severe heart failure.  A blood clot in the portal vein.  Sarcoidosis. This is an inflammatory disease that can affect the liver.  Schistosomiasis. This is a parasitic infection that can cause liver damage. What are the signs or symptoms? Usually there are no symptoms unless the esophageal varices bleed. Symptoms of bleeding esophageal varices include:  Vomiting material that is bright red or that is black and looks like coffee grounds.  Coughing up blood.  Black, tarry stools.  Dizziness or lightheadedness.  Low  blood pressure.  Loss of consciousness. How is this diagnosed? This condition is diagnosed with tests, such as:  Endoscopy. During this test a thin, lighted tube is inserted through the mouth and into the esophagus.  Blood tests. These may be done to check liver function, blood counts, and the body's ability to form blood clots. How is this treated? This condition may  be treated with:  Medicines that reduce pressure in the esophageal varices and reduce the risk of bleeding.  Procedures to reduce pressure in the esophageal varices and reduce the risk of bleeding or stop bleeding. These include:  Variceal ligation. In this procedure, a rubber band is placed around the esophageal varices to keep them from bleeding.  Injection therapy. This treatment involves an injection that causes the esophageal varices to shrink and close (sclerotherapy). Medicines that tighten blood vessels or alter blood flow may also be used.  Balloon tamponade. In this procedure, a tube is put into the esophagus and a balloon is passed through it and inflated.  Transjugular intrahepatic portosystemic shunt (TIPS) placement. In this procedure, a small tube is placed within the liver veins. This decreases blood flow and pressure to the esophageal varices.  A liver transplant. This may be done if other treatments do not work. Follow these instructions at home:  Take medicines only as directed by your health care provider.  Follow your health care provider's instructions about rest and physical activity. Get help right away if:  You have any symptoms of this condition after treatment.  You are unable to eat or drink.  You have chest pain. This information is not intended to replace advice given to you by your health care provider. Make sure you discuss any questions you have with your health care provider. Document Released: 06/08/2003 Document Revised: 08/24/2015 Document Reviewed: 03/14/2014 Elsevier Interactive Patient Education  2017 [place]. Esophagogastroduodenoscopy Esophagogastroduodenoscopy (EGD) is a procedure to examine the lining of the esophagus, stomach, and first part of the small intestine (duodenum). This procedure is done to check for problems such as inflammation, bleeding, ulcers, or growths. During this procedure, a long, flexible, lighted tube with a  camera attached (endoscope) is inserted down the throat. Tell a health care provider about:  Any allergies you have.  All medicines you are taking, including vitamins, herbs, eye drops, creams, and over-the-counter medicines.  Any problems you or family members have had with anesthetic medicines.  Any blood disorders you have.  Any surgeries you have had.  Any medical conditions you have.  Whether you are pregnant or may be pregnant. What are the risks? Generally, this is a safe procedure. However, problems may occur, including:  Infection.  Bleeding.  A tear (perforation) in the esophagus, stomach, or duodenum.  Trouble breathing.  Excessive sweating.  Spasms of the larynx.  A slowed heartbeat.  Low blood pressure. What happens before the procedure?  Follow instructions from your health care provider about eating or drinking restrictions.  Ask your health care provider about:  Changing or stopping your regular medicines. This is especially important if you are taking diabetes medicines or blood thinners.  Taking medicines such as aspirin and ibuprofen. These medicines can thin your blood. Do not take these medicines before your procedure if your health care provider instructs you not to.  Plan to have someone take you home after the procedure.  If you wear dentures, be ready to remove them before the procedure. What happens during the procedure?  To reduce your risk  of infection, your health care team will wash or sanitize their hands.  An IV tube will be put in a vein in your hand or arm. You will get medicines and fluids through this tube.  You will be given one or more of the following:  A medicine to help you relax (sedative).  A medicine to numb the area (local anesthetic). This medicine may be sprayed into your throat. It will make you feel more comfortable and keep you from gagging or coughing during the procedure.  A medicine for pain.  A mouth  guard may be placed in your mouth to protect your teeth and to keep you from biting on the endoscope.  You will be asked to lie on your left side.  The endoscope will be lowered down your throat into your esophagus, stomach, and duodenum.  Air will be put into the endoscope. This will help your health care provider see better.  The lining of your esophagus, stomach, and duodenum will be examined.  Your health care provider may:  Take a tissue sample so it can be looked at in a lab (biopsy).  Remove growths.  Remove objects (foreign bodies) that are stuck.  Treat any bleeding with medicines or other devices that stop tissue from bleeding.  Widen (dilate) or stretch narrowed areas of your esophagus and stomach.  The endoscope will be taken out. The procedure may vary among health care providers and hospitals. What happens after the procedure?  Your blood pressure, heart rate, breathing rate, and blood oxygen level will be monitored often until the medicines you were given have worn off.  Do not eat or drink anything until the numbing medicine has worn off and your gag reflex has returned. This information is not intended to replace advice given to you by your health care provider. Make sure you discuss any questions you have with your health care provider. Document Released: 07/19/2004 Document Revised: 08/24/2015 Document Reviewed: 02/09/2015 Elsevier Interactive Patient Education  2017 [employer].

## 2016-07-04 ENCOUNTER — Ambulatory Visit (HOSPITAL_COMMUNITY): Admission: RE | Admit: 2016-07-04 | Payer: Medicare PPO | Source: Ambulatory Visit

## 2016-07-05 ENCOUNTER — Ambulatory Visit (INDEPENDENT_AMBULATORY_CARE_PROVIDER_SITE_OTHER): Payer: Medicare PPO | Admitting: "Endocrinology

## 2016-07-05 ENCOUNTER — Encounter: Payer: Self-pay | Admitting: "Endocrinology

## 2016-07-05 ENCOUNTER — Encounter (HOSPITAL_COMMUNITY)
Admission: RE | Admit: 2016-07-05 | Discharge: 2016-07-05 | Disposition: A | Discharge status: Home / Self Care | Payer: Medicare PPO | Source: Ambulatory Visit | Attending: Internal Medicine | Admitting: Internal Medicine

## 2016-07-05 VITALS — BP 139/78 | HR 64 | Ht 67.0 in | Wt 242.0 lb

## 2016-07-05 DIAGNOSIS — I10 Essential (primary) hypertension: Secondary | ICD-10-CM | POA: Diagnosis not present

## 2016-07-05 DIAGNOSIS — E1165 Type 2 diabetes mellitus with hyperglycemia: Secondary | ICD-10-CM

## 2016-07-05 DIAGNOSIS — E118 Type 2 diabetes mellitus with unspecified complications: Secondary | ICD-10-CM

## 2016-07-05 DIAGNOSIS — IMO0001 Reserved for inherently not codable concepts without codable children: Secondary | ICD-10-CM

## 2016-07-05 DIAGNOSIS — E782 Mixed hyperlipidemia: Secondary | ICD-10-CM | POA: Diagnosis not present

## 2016-07-05 DIAGNOSIS — Z794 Long term (current) use of insulin: Secondary | ICD-10-CM | POA: Diagnosis not present

## 2016-07-05 DIAGNOSIS — Z6837 Body mass index (BMI) 37.0-37.9, adult: Secondary | ICD-10-CM

## 2016-07-05 DIAGNOSIS — E6609 Other obesity due to excess calories: Secondary | ICD-10-CM | POA: Diagnosis not present

## 2016-07-05 DIAGNOSIS — IMO0002 Reserved for concepts with insufficient information to code with codable children: Secondary | ICD-10-CM

## 2016-07-05 MED ORDER — INSULIN ASPART 100 UNIT/ML ~~LOC~~ SOLN: 18.0000 [IU] | Freq: Three times a day (TID) | SUBCUTANEOUS | 3 refills | Status: DC

## 2016-07-05 MED ORDER — INSULIN DETEMIR 100 UNIT/ML ~~LOC~~ SOLN: 80.0000 [IU] | Freq: Every day | SUBCUTANEOUS | 2 refills | Status: DC

## 2016-07-05 NOTE — Patient Instructions (Signed)

## 2016-07-05 NOTE — Progress Notes (Signed)
Subjective:    Patient ID: Savannah Miles, female    DOB: 06-19-1955. Patient is being seen in Follow-up for management of diabetes requested by  Kennieth Rad, MD  Past Medical History:  Diagnosis Date  . Arthritis   . Borderline glaucoma   . 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 Cancer [facility])    breast cancer  . Depression   . Diabetes mellitus, type II ([place])   . GERD (gastroesophageal reflux disease)   . Heart murmur   . Hyperlipidemia   . Hypertension   . Kidney disease   . Osteoporosis    Past Surgical History:  Procedure Laterality Date  . BREAST LUMPECTOMY Left   . COLONOSCOPY WITH PROPOFOL N/A 10/27/2015   Procedure: COLONOSCOPY WITH PROPOFOL;  Surgeon: Rogene Houston, MD;  Location: [facility] ENDO [facility];  Service: Endoscopy;  Laterality: N/A;  100 - moved to 8:30  . DILATION AND CURETTAGE OF UTERUS    . ORIF FOOT FRACTURE Left   . OTHER SURGICAL HISTORY Left Leg, Arm, Hip fx   otif, has rods and screws in each.  Marland Kitchen POLYPECTOMY  10/27/2015   Procedure: POLYPECTOMY;  Surgeon: Rogene Houston, MD;  Location: [facility];  Service: Endoscopy;;  . SPINAL FUSION    . TRACHEOSTOMY     Social History   Social History  . Marital status: Divorced    Spouse name: N/A  . Number of children: N/A  . Years of education: N/A   Social History Main Topics  . Smoking status: Never Smoker  . Smokeless tobacco: Never Used  . Alcohol use No  . Drug use: No  . Sexual activity: Yes    Birth control/ protection: Post-menopausal   Other Topics Concern  . None   Social History Narrative  . None   Outpatient Encounter Prescriptions as of 07/05/2016  Medication Sig  . albuterol (PROVENTIL HFA;VENTOLIN HFA) 108 (90 Base) MCG/ACT inhaler Inhale 1-2 puffs into the lungs as needed for wheezing or shortness of breath.  Marland Kitchen amLODipine (NORVASC) 5 MG tablet Take 10 mg by mouth daily.  Marland Kitchen atorvastatin (LIPITOR) 20 MG tablet Take 20 mg by mouth daily.  Marland Kitchen buPROPion (WELLBUTRIN) 75 MG tablet Take 75  mg by mouth daily.  . cephALEXin (KEFLEX) 500 MG capsule TAKE 4 CAPSULES BY MOUTH 1 HOUR BEFORE DENTAL APPOINTMENT  . citalopram (CELEXA) 40 MG tablet Take 40 mg by mouth daily.  Marland Kitchen denosumab (PROLIA) 60 MG/ML SOLN injection Inject 60 mg into the skin. Administer in upper arm, thigh, or abdomen  Patient states that she gets twice a year.  . gabapentin (NEURONTIN) 800 MG tablet Take 800 mg by mouth. 5 x daily  . glucose blood (ACCU-CHEK GUIDE) test strip 1 each by Other route 4 (four) times daily. Use as instructed. E11.65  . HYDROcodone-acetaminophen (NORCO/VICODIN) 5-325 MG tablet Take 1 tablet by mouth every 6 (six) hours as needed for moderate pain.  Marland Kitchen insulin aspart (NOVOLOG) 100 UNIT/ML injection Inject 18-24 Units into the skin 3 (three) times daily with meals.  . insulin detemir (LEVEMIR) 100 UNIT/ML injection Inject 0.8 mLs (80 Units total) into the skin at bedtime.  Marland Kitchen losartan (COZAAR) 100 MG tablet Take 100 mg by mouth daily.  . metFORMIN (GLUCOPHAGE) 500 MG tablet Take 1 tablet (500 mg total) by mouth 2 (two) times daily with a meal.  . metoprolol tartrate (LOPRESSOR) 25 MG tablet Take 50 mg by mouth 2 (two) times daily.   Marland Kitchen  nitroGLYCERIN (NITROSTAT) 0.4 MG SL tablet USE 1 TAB SUBLINGUALLY EVERY 5 MINUTES FOR CHEST PAIN AS NEEDED  . omeprazole (PRILOSEC) 20 MG capsule Take 1 capsule (20 mg total) by mouth daily before breakfast.  . omeprazole (PRILOSEC) 20 MG capsule Take 1 capsule (20 mg total) by mouth daily.  . potassium chloride SA (K-DUR,KLOR-CON) 20 MEQ tablet Take 1 tablet (20 mEq total) by mouth daily.  Marland Kitchen torsemide (DEMADEX) 20 MG tablet Take 60 mg by mouth daily.   . Vitamin D, Ergocalciferol, (DRISDOL) 50000 units CAPS capsule Take 50,000 Units by mouth. Twice a week.  . [DISCONTINUED] insulin aspart (NOVOLOG) 100 UNIT/ML injection Inject 18-24 Units into the skin 3 (three) times daily before meals.  . [DISCONTINUED] insulin detemir (LEVEMIR) 100 UNIT/ML injection Inject 80  Units into the skin at bedtime.   No facility-administered encounter medications on file as of 07/05/2016.    ALLERGIES: Allergies  Allergen Reactions  . Codeine     unknown   VACCINATION STATUS:  There is no immunization history on file for this patient.  Diabetes  She presents for her follow-up diabetic visit. She has type 2 diabetes mellitus. Onset time: Patient was diagnosed at approximate age of 61 years. Her disease course has been improving. There are no hypoglycemic associated symptoms. Pertinent negatives for hypoglycemia include no confusion, headaches, pallor or seizures. Associated symptoms include fatigue and polydipsia. Pertinent negatives for diabetes include no chest pain, no polyphagia, no polyuria and no weight loss. There are no hypoglycemic complications. Symptoms are improving. There are no diabetic complications. Risk factors for coronary artery disease include diabetes mellitus, dyslipidemia, hypertension and sedentary lifestyle. Current diabetic treatment includes insulin injections and oral agent (monotherapy) (She is on toujeo 38 units in the morning and 68 units at bedtime, Humalog 28 units 3 times a day with meals. She is also on metformin 1000 mg by mouth twice a day.). Her weight is increasing steadily. She is following a generally unhealthy diet. She has not had a previous visit with a dietitian. She participates in exercise intermittently. Her overall blood glucose range is >200 mg/dl. (She has monitored only 50 times in the last 30 days, less than 50% of recommended. Average blood glucose 257. A1c improving to 9.8%.) An ACE inhibitor/angiotensin II receptor blocker is being taken.  Hyperlipidemia  This is a chronic problem. The current episode started more than 1 year ago. Pertinent negatives include no chest pain, myalgias or shortness of breath. Current antihyperlipidemic treatment includes statins. Risk factors for coronary artery disease include diabetes mellitus,  dyslipidemia, hypertension, obesity and a sedentary lifestyle.  Hypertension  This is a chronic problem. The current episode started more than 1 year ago. Pertinent negatives include no chest pain, headaches, palpitations or shortness of breath. Risk factors for coronary artery disease include diabetes mellitus, dyslipidemia, obesity and sedentary lifestyle. Past treatments include angiotensin blockers.      Review of Systems  Constitutional: Positive for fatigue. Negative for chills, fever, unexpected weight change and weight loss.  HENT: Negative for trouble swallowing and voice change.   Eyes: Negative for visual disturbance.  Respiratory: Negative for cough, shortness of breath and wheezing.   Cardiovascular: Negative for chest pain, palpitations and leg swelling.  Gastrointestinal: Negative for diarrhea, nausea and vomiting.  Endocrine: Positive for polydipsia. Negative for cold intolerance, heat intolerance, polyphagia and polyuria.  Musculoskeletal: Negative for arthralgias and myalgias.  Skin: Negative for color change, pallor, rash and wound.  Neurological: Negative for seizures and headaches.  Psychiatric/Behavioral: Negative for confusion and suicidal ideas.    Objective:    BP 139/78   Pulse 64   Ht 5\' 7"  (1.702 m)   Wt 242 lb (109.8 kg)   BMI 37.90 kg/m   Wt Readings from Last 3 Encounters:  07/05/16 242 lb (109.8 kg)  06/25/16 241 lb 14.4 oz (109.7 kg)  05/07/16 236 lb (107 kg)    Physical Exam  Constitutional: She is oriented to person, place, and time. She appears well-developed.  HENT:  Head: Normocephalic and atraumatic.  Eyes: EOM are normal.  Neck: Normal range of motion. Neck supple. No tracheal deviation present. No thyromegaly present.  Cardiovascular: Normal rate and regular rhythm.   Murmur heard. She has systolic murmur which radiates to the left side of the neck. She is following with cardiology had echocardiogram in the last 2 years. Records not  available to review. I advised her to continue follow up closely with cardiologist.   Pulmonary/Chest: Effort normal and breath sounds normal.  Abdominal: Soft. Bowel sounds are normal. There is no tenderness. There is no guarding.  Musculoskeletal: Normal range of motion. She exhibits no edema.  Neurological: She is alert and oriented to person, place, and time. She has normal reflexes. No cranial nerve deficit. Coordination normal.  Skin: Skin is warm and dry. No rash noted. No erythema. No pallor.  Psychiatric: She has a normal mood and affect. Judgment normal.     Diabetic Labs (most recent): Lab Results  Component Value Date   HGBA1C 12.3 02/29/2016   HGBA1C 9.2 07/13/2015   HGBA1C 11.6 04/19/2015    Labs from July 2016 included A1c of 8.5%, completely labs to be scanned into her records.   Assessment & Plan:   1. Uncontrolled type 2 diabetes mellitus with complication, with long-term current use of insulin ([place])  - Patient has currently uncontrolled symptomatic type 2 DM since  61 years of age. - She is alarmingly noncompliant. She missed her appointments since May 2017. She returns with increasing A1c of  9.8% slowly improving from 12.3% .  She has monitored  less than 50% of recommended, average blood glucose 257 in the last 14 days improving from 351.. - Recent labs reviewed.   Her diabetes is complicated by obesity and patient remains at a high risk for more acute and chronic complications of diabetes which include CAD, CVA, CKD, retinopathy, and neuropathy. These are all discussed in detail with the patient.  - I have counseled the patient on diet management and weight loss, by adopting a carbohydrate restricted/protein rich diet.  - Suggestion is made for patient to avoid simple carbohydrates   from their diet including Cakes , Desserts, Ice Cream,  Soda (  diet and regular) , Sweet Tea , Candies,  Chips, Cookies, Artificial Sweeteners,   and "Sugar-free" Products .  This will help patient to have stable blood glucose profile and potentially avoid unintended weight gain.  - I encouraged the patient to switch to  unprocessed or minimally processed complex starch and increased protein intake (animal or plant source), fruits, and vegetables.  - Patient is advised to stick to a routine mealtimes to eat 3 meals  a day and avoid unnecessary snacks ( to snack only to correct hypoglycemia).  - The patient will be scheduled with Jearld Fenton, RDN, CDE for individualized DM education.  - I have approached patient with the following individualized plan to manage diabetes and patient agrees:    I  urged her  to resume  and continue basal/ bolus insulin.   - I advised her to continue Levemir at 80 units  daily at bedtime,  Increase NovoLog to 18 units 3 times a day before meals  for pre-meal BG readings of 90-150mg /dl, plus patient specific correction dose for unexpected hyperglycemia above 150mg /dl, associated with strict monitoring of glucose  AC and HS.  - She will be considered for Novolin 70/30 if she cannot afford insulin analogs. - Patient is warned not to take insulin without proper monitoring per orders. -Adjustment parameters are given for hypo and hyperglycemia in writing. -Patient is encouraged to call clinic for blood glucose levels less than 70 or above 300 mg /dl. - I will continue metformin to 500 mg by mouth twice a day, therapeutically suitable for patient.  - Patient will be considered for incretin therapy as appropriate next visit. - Patient specific target  A1c;  LDL, HDL, Triglycerides, and  Waist Circumference were discussed in detail.  2) BP/HTN: uncontrolled. Continue current medications including ACEI/ARB. 3) Lipids/HPL: Controlled LDL 72, continue statins. 4)  Weight/Diet:   patient is obese, CDE Consult has been initiated , exercise, and detailed carbohydrates information provided.  5) Chronic Care/Health Maintenance:  -Patient is on  ACEI/ARB and Statin medications and encouraged to continue to follow up with Ophthalmology, Podiatrist at least yearly or according to recommendations, and advised to   stay away from smoking. I have recommended yearly flu vaccine and pneumonia vaccination at least every 5 years; moderate intensity exercise for up to 150 minutes weekly; and  sleep for at least 7 hours a day.  - 25 minutes of time was spent on the care of this patient , 50% of which was applied for counseling on diabetes complications and their preventions. -Given her complaint of fatigue and history of systolic murmur suggestive of valvular insufficiency, I advised her to call and follow up with her cardiologist. - Patient to bring meter and  blood glucose logs during their next visit.   - I advised patient to maintain close follow up with Kennieth Rad, MD for primary care needs.  Follow up plan: - Return in about 3 months (around 10/04/2016) for follow up with pre-visit labs, meter, and logs.  Glade Lloyd, MD Phone: [phone]  Fax: [phone]   07/05/2016, 10:45 AM

## 2016-07-10 ENCOUNTER — Encounter (HOSPITAL_COMMUNITY): Admission: RE | Admit: 2016-07-10 | Payer: Medicare PPO | Source: Ambulatory Visit

## 2016-07-10 ENCOUNTER — Encounter (HOSPITAL_COMMUNITY): Payer: Self-pay

## 2016-07-11 ENCOUNTER — Encounter (INDEPENDENT_AMBULATORY_CARE_PROVIDER_SITE_OTHER): Payer: Self-pay | Admitting: *Deleted

## 2016-07-11 ENCOUNTER — Other Ambulatory Visit (INDEPENDENT_AMBULATORY_CARE_PROVIDER_SITE_OTHER): Payer: Self-pay | Admitting: Internal Medicine

## 2016-07-11 DIAGNOSIS — I8500 Esophageal varices without bleeding: Secondary | ICD-10-CM

## 2016-07-11 DIAGNOSIS — K7469 Other cirrhosis of liver: Secondary | ICD-10-CM

## 2016-07-12 ENCOUNTER — Encounter (HOSPITAL_COMMUNITY): Admission: RE | Payer: Self-pay | Source: Ambulatory Visit

## 2016-07-12 ENCOUNTER — Ambulatory Visit (HOSPITAL_COMMUNITY): Admission: RE | Admit: 2016-07-12 | Payer: Medicare PPO | Source: Ambulatory Visit | Admitting: Internal Medicine

## 2016-07-12 SURGERY — ESOPHAGOGASTRODUODENOSCOPY (EGD) WITH PROPOFOL
Anesthesia: Monitor Anesthesia Care

## 2016-08-13 ENCOUNTER — Other Ambulatory Visit: Payer: Self-pay

## 2016-08-13 MED ORDER — METFORMIN HCL 500 MG PO TABS: 500.0000 mg | ORAL_TABLET | Freq: Two times a day (BID) | ORAL | 0 refills | Status: DC

## 2016-08-13 MED ORDER — INSULIN DETEMIR 100 UNIT/ML ~~LOC~~ SOLN: 80.0000 [IU] | Freq: Every day | SUBCUTANEOUS | 0 refills | Status: DC

## 2016-08-13 MED ORDER — INSULIN ASPART 100 UNIT/ML ~~LOC~~ SOLN: 18.0000 [IU] | Freq: Three times a day (TID) | SUBCUTANEOUS | 0 refills | Status: DC

## 2016-08-14 NOTE — Patient Instructions (Signed)
Savannah Miles  08/14/2016     @PREFPERIOPPHARMACY @   Your procedure is scheduled on  08/23/2013   Report to [facility] at  67  A.M.  Call this number if you have problems the morning of surgery:  [phone]   Remember:  Do not eat food or drink liquids after midnight.  Take these medicines the morning of surgery with A SIP OF WATER  Norvasc, wellbutrin, celexa, neurontin, hydrocodone, cozaar, metoprolol, prilosec. Use your inhalers before you come. Take 1/2 of your normal insulin dosage the night before your procedure. DO NOT take any medications for diabetes the morning of surgery.   Do not wear jewelry, make-up or nail polish.  Do not wear lotions, powders, or perfumes, or deoderant.  Do not shave 48 hours prior to surgery.  Men may shave face and neck.  Do not bring valuables to the hospital.  [facility] is not responsible for any belongings or valuables.  Contacts, dentures or bridgework may not be worn into surgery.  Leave your suitcase in the car.  After surgery it may be brought to your room.  For patients admitted to the hospital, discharge time will be determined by your treatment team.  Patients discharged the day of surgery will not be allowed to drive home.   Name and phone number of your driver:   family Special instructions:  Follow the diet instructions given to you by Dr Olevia Perches office.  Please read over the following fact sheets that you were given. Anesthesia Post-op Instructions and Care and Recovery After Surgery       Esophagogastroduodenoscopy Esophagogastroduodenoscopy (EGD) is a procedure to examine the lining of the esophagus, stomach, and first part of the small intestine (duodenum). This procedure is done to check for problems such as inflammation, bleeding, ulcers, or growths. During this procedure, a long, flexible, lighted tube with a camera attached (endoscope) is inserted down the throat. Tell a health care provider  about:  Any allergies you have.  All medicines you are taking, including vitamins, herbs, eye drops, creams, and over-the-counter medicines.  Any problems you or family members have had with anesthetic medicines.  Any blood disorders you have.  Any surgeries you have had.  Any medical conditions you have.  Whether you are pregnant or may be pregnant. What are the risks? Generally, this is a safe procedure. However, problems may occur, including:  Infection.  Bleeding.  A tear (perforation) in the esophagus, stomach, or duodenum.  Trouble breathing.  Excessive sweating.  Spasms of the larynx.  A slowed heartbeat.  Low blood pressure. What happens before the procedure?  Follow instructions from your health care provider about eating or drinking restrictions.  Ask your health care provider about:  Changing or stopping your regular medicines. This is especially important if you are taking diabetes medicines or blood thinners.  Taking medicines such as aspirin and ibuprofen. These medicines can thin your blood. Do not take these medicines before your procedure if your health care provider instructs you not to.  Plan to have someone take you home after the procedure.  If you wear dentures, be ready to remove them before the procedure. What happens during the procedure?  To reduce your risk of infection, your health care team will wash or sanitize their hands.  An IV tube will be put in a vein in your hand or arm. You will get medicines and fluids through this tube.  You  will be given one or more of the following:  A medicine to help you relax (sedative).  A medicine to numb the area (local anesthetic). This medicine may be sprayed into your throat. It will make you feel more comfortable and keep you from gagging or coughing during the procedure.  A medicine for pain.  A mouth guard may be placed in your mouth to protect your teeth and to keep you from biting on  the endoscope.  You will be asked to lie on your left side.  The endoscope will be lowered down your throat into your esophagus, stomach, and duodenum.  Air will be put into the endoscope. This will help your health care provider see better.  The lining of your esophagus, stomach, and duodenum will be examined.  Your health care provider may:  Take a tissue sample so it can be looked at in a lab (biopsy).  Remove growths.  Remove objects (foreign bodies) that are stuck.  Treat any bleeding with medicines or other devices that stop tissue from bleeding.  Widen (dilate) or stretch narrowed areas of your esophagus and stomach.  The endoscope will be taken out. The procedure may vary among health care providers and hospitals. What happens after the procedure?  Your blood pressure, heart rate, breathing rate, and blood oxygen level will be monitored often until the medicines you were given have worn off.  Do not eat or drink anything until the numbing medicine has worn off and your gag reflex has returned. This information is not intended to replace advice given to you by your health care provider. Make sure you discuss any questions you have with your health care provider. Document Released: 07/19/2004 Document Revised: 08/24/2015 Document Reviewed: 02/09/2015 Elsevier Interactive Patient Education  2017 [place]. Esophagogastroduodenoscopy, Care After Refer to this sheet in the next few weeks. These instructions provide you with information about caring for yourself after your procedure. Your health care provider may also give you more specific instructions. Your treatment has been planned according to current medical practices, but problems sometimes occur. Call your health care provider if you have any problems or questions after your procedure. What can I expect after the procedure? After the procedure, it is common to have:  A sore  throat.  Nausea.  Bloating.  Dizziness.  Fatigue. Follow these instructions at home:  Do not eat or drink anything until the numbing medicine (local anesthetic) has worn off and your gag reflex has returned. You will know that the local anesthetic has worn off when you can swallow comfortably.  Do not drive for 24 hours if you received a medicine to help you relax (sedative).  If your health care provider took a tissue sample for testing during the procedure, make sure to get your test results. This is your responsibility. Ask your health care provider or the department performing the test when your results will be ready.  Keep all follow-up visits as told by your health care provider. This is important. Contact a health care provider if:  You cannot stop coughing.  You are not urinating.  You are urinating less than usual. Get help right away if:  You have trouble swallowing.  You cannot eat or drink.  You have throat or chest pain that gets worse.  You are dizzy or light-headed.  You faint.  You have nausea or vomiting.  You have chills.  You have a fever.  You have severe abdominal pain.  You have black, tarry,  or bloody stools. This information is not intended to replace advice given to you by your health care provider. Make sure you discuss any questions you have with your health care provider. Document Released: 03/04/2012 Document Revised: 08/24/2015 Document Reviewed: 02/09/2015 Elsevier Interactive Patient Education  2017 [place] Anesthesia is a term that refers to techniques, procedures, and medicines that help a person stay safe and comfortable during a medical procedure. Monitored anesthesia care, or sedation, is one type of anesthesia. Your anesthesia specialist may recommend sedation if you will be having a procedure that does not require you to be unconscious, such as:  Cataract surgery.  A dental procedure.  A  biopsy.  A colonoscopy. During the procedure, you may receive a medicine to help you relax (sedative). There are three levels of sedation:  Mild sedation. At this level, you may feel awake and relaxed. You will be able to follow directions.  Moderate sedation. At this level, you will be sleepy. You may not remember the procedure.  Deep sedation. At this level, you will be asleep. You will not remember the procedure. The more medicine you are given, the deeper your level of sedation will be. Depending on how you respond to the procedure, the anesthesia specialist may change your level of sedation or the type of anesthesia to fit your needs. An anesthesia specialist will monitor you closely during the procedure. Let your health care provider know about:  Any allergies you have.  All medicines you are taking, including vitamins, herbs, eye drops, creams, and over-the-counter medicines.  Any use of steroids (by mouth or as a cream).  Any problems you or family members have had with sedatives and anesthetic medicines.  Any blood disorders you have.  Any surgeries you have had.  Any medical conditions you have, such as sleep apnea.  Whether you are pregnant or may be pregnant.  Any use of cigarettes, alcohol, or street drugs. What are the risks? Generally, this is a safe procedure. However, problems may occur, including:  Getting too much medicine (oversedation).  Nausea.  Allergic reaction to medicines.  Trouble breathing. If this happens, a breathing tube may be used to help with breathing. It will be removed when you are awake and breathing on your own.  Heart trouble.  Lung trouble. Before the procedure Staying hydrated  Follow instructions from your health care provider about hydration, which may include:  Up to 2 hours before the procedure - you may continue to drink clear liquids, such as water, clear fruit juice, black coffee, and plain tea. Eating and drinking  restrictions  Follow instructions from your health care provider about eating and drinking, which may include:  8 hours before the procedure - stop eating heavy meals or foods such as meat, fried foods, or fatty foods.  6 hours before the procedure - stop eating light meals or foods, such as toast or cereal.  6 hours before the procedure - stop drinking milk or drinks that contain milk.  2 hours before the procedure - stop drinking clear liquids. Medicines  Ask your health care provider about:  Changing or stopping your regular medicines. This is especially important if you are taking diabetes medicines or blood thinners.  Taking medicines such as aspirin and ibuprofen. These medicines can thin your blood. Do not take these medicines before your procedure if your health care provider instructs you not to. Tests and exams  You will have a physical exam.  You may  have blood tests done to show:  How well your kidneys and liver are working.  How well your blood can clot.  General instructions  Plan to have someone take you home from the hospital or clinic.  If you will be going home right after the procedure, plan to have someone with you for 24 hours. What happens during the procedure?  Your blood pressure, heart rate, breathing, level of pain and overall condition will be monitored.  An IV tube will be inserted into one of your veins.  Your anesthesia specialist will give you medicines as needed to keep you comfortable during the procedure. This may mean changing the level of sedation.  The procedure will be performed. After the procedure  Your blood pressure, heart rate, breathing rate, and blood oxygen level will be monitored until the medicines you were given have worn off.  Do not drive for 24 hours if you received a sedative.  You may:  Feel sleepy, clumsy, or nauseous.  Feel forgetful about what happened after the procedure.  Have a sore throat if you had a  breathing tube during the procedure.  Vomit. This information is not intended to replace advice given to you by your health care provider. Make sure you discuss any questions you have with your health care provider. Document Released: 12/12/2004 Document Revised: 08/25/2015 Document Reviewed: 07/09/2015 Elsevier Interactive Patient Education  2017 [place], Care After These instructions provide you with information about caring for yourself after your procedure. Your health care provider may also give you more specific instructions. Your treatment has been planned according to current medical practices, but problems sometimes occur. Call your health care provider if you have any problems or questions after your procedure. What can I expect after the procedure? After your procedure, it is common to:  Feel sleepy for several hours.  Feel clumsy and have poor balance for several hours.  Feel forgetful about what happened after the procedure.  Have poor judgment for several hours.  Feel nauseous or vomit.  Have a sore throat if you had a breathing tube during the procedure. Follow these instructions at home: For at least 24 hours after the procedure:    Do not:  Participate in activities in which you could fall or become injured.  Drive.  Use heavy machinery.  Drink alcohol.  Take sleeping pills or medicines that cause drowsiness.  Make important decisions or sign legal documents.  Take care of children on your own.  Rest. Eating and drinking   Follow the diet that is recommended by your health care provider.  If you vomit, drink water, juice, or soup when you can drink without vomiting.  Make sure you have little or no nausea before eating solid foods. General instructions   Have a responsible adult stay with you until you are awake and alert.  Take over-the-counter and prescription medicines only as told by your health care  provider.  If you smoke, do not smoke without supervision.  Keep all follow-up visits as told by your health care provider. This is important. Contact a health care provider if:  You keep feeling nauseous or you keep vomiting.  You feel light-headed.  You develop a rash.  You have a fever. Get help right away if:  You have trouble breathing. This information is not intended to replace advice given to you by your health care provider. Make sure you discuss any questions you have with your health care provider. Document  Released: 07/09/2015 Document Revised: 11/08/2015 Document Reviewed: 07/09/2015 Elsevier Interactive Patient Education  2017 [employer].

## 2016-08-20 ENCOUNTER — Encounter (HOSPITAL_COMMUNITY)
Admission: RE | Admit: 2016-08-20 | Discharge: 2016-08-20 | Disposition: A | Discharge status: Home / Self Care | Payer: Medicare PPO | Source: Ambulatory Visit | Attending: Internal Medicine | Admitting: Internal Medicine

## 2016-08-20 ENCOUNTER — Encounter (HOSPITAL_COMMUNITY): Payer: Self-pay

## 2016-08-20 DIAGNOSIS — Z853 Personal history of malignant neoplasm of breast: Secondary | ICD-10-CM | POA: Diagnosis not present

## 2016-08-20 DIAGNOSIS — E785 Hyperlipidemia, unspecified: Secondary | ICD-10-CM | POA: Diagnosis not present

## 2016-08-20 DIAGNOSIS — K7581 Nonalcoholic steatohepatitis (NASH): Secondary | ICD-10-CM | POA: Diagnosis not present

## 2016-08-20 DIAGNOSIS — K7460 Unspecified cirrhosis of liver: Secondary | ICD-10-CM | POA: Diagnosis not present

## 2016-08-20 DIAGNOSIS — I509 Heart failure, unspecified: Secondary | ICD-10-CM | POA: Diagnosis not present

## 2016-08-20 DIAGNOSIS — I8510 Secondary esophageal varices without bleeding: Secondary | ICD-10-CM | POA: Diagnosis not present

## 2016-08-20 DIAGNOSIS — K766 Portal hypertension: Secondary | ICD-10-CM | POA: Diagnosis not present

## 2016-08-20 DIAGNOSIS — Z7983 Long term (current) use of bisphosphonates: Secondary | ICD-10-CM | POA: Diagnosis not present

## 2016-08-20 DIAGNOSIS — K219 Gastro-esophageal reflux disease without esophagitis: Secondary | ICD-10-CM | POA: Diagnosis not present

## 2016-08-20 DIAGNOSIS — I110 Hypertensive heart disease with heart failure: Secondary | ICD-10-CM | POA: Diagnosis not present

## 2016-08-20 DIAGNOSIS — Z7982 Long term (current) use of aspirin: Secondary | ICD-10-CM | POA: Diagnosis not present

## 2016-08-20 DIAGNOSIS — K3189 Other diseases of stomach and duodenum: Secondary | ICD-10-CM | POA: Diagnosis not present

## 2016-08-20 DIAGNOSIS — M810 Age-related osteoporosis without current pathological fracture: Secondary | ICD-10-CM | POA: Diagnosis not present

## 2016-08-20 DIAGNOSIS — F329 Major depressive disorder, single episode, unspecified: Secondary | ICD-10-CM | POA: Diagnosis not present

## 2016-08-20 DIAGNOSIS — Z981 Arthrodesis status: Secondary | ICD-10-CM | POA: Diagnosis not present

## 2016-08-20 DIAGNOSIS — Z794 Long term (current) use of insulin: Secondary | ICD-10-CM | POA: Diagnosis not present

## 2016-08-20 DIAGNOSIS — E1140 Type 2 diabetes mellitus with diabetic neuropathy, unspecified: Secondary | ICD-10-CM | POA: Diagnosis not present

## 2016-08-20 HISTORY — DX: Other specified postprocedural states: Z98.890

## 2016-08-20 HISTORY — DX: Polyneuropathy, unspecified: G62.9

## 2016-08-20 HISTORY — DX: Heart failure, unspecified: I50.9

## 2016-08-20 HISTORY — DX: Nausea with vomiting, unspecified: R11.2

## 2016-08-20 LAB — CBC
HCT: 36.4 % (ref 36.0–46.0)
Hemoglobin: 11.9 g/dL — ABNORMAL LOW (ref 12.0–15.0)
MCH: 30.7 pg (ref 26.0–34.0)
MCHC: 32.7 g/dL (ref 30.0–36.0)
MCV: 94.1 fL (ref 78.0–100.0)
Platelets: 116 K/uL — ABNORMAL LOW (ref 150–400)
RBC: 3.87 MIL/uL (ref 3.87–5.11)
RDW: 13.5 % (ref 11.5–15.5)
WBC: 4.4 K/uL (ref 4.0–10.5)

## 2016-08-20 LAB — BASIC METABOLIC PANEL
Anion gap: 8 (ref 5–15)
BUN: 19 mg/dL (ref 6–20)
CALCIUM: 8.9 mg/dL (ref 8.9–10.3)
CO2: 27 mmol/L (ref 22–32)
CREATININE: 0.89 mg/dL (ref 0.44–1.00)
Chloride: 105 mmol/L (ref 101–111)
GFR calc Af Amer: >60 mL/min (ref >60)
Glucose, Bld: 137 mg/dL — ABNORMAL HIGH (ref 65–99)
Potassium: 3.8 mmol/L (ref 3.5–5.1)
Sodium: 140 mmol/L (ref 135–145)

## 2016-08-20 NOTE — Progress Notes (Signed)
   08/20/16 1014  OBSTRUCTIVE SLEEP APNEA  Have you ever been diagnosed with sleep apnea through a sleep study? No (pt going for testing 08/2016.)  Do you snore loudly (loud enough to be heard through closed doors)?  1  Do you often feel tired, fatigued, or sleepy during the daytime (such as falling asleep during driving or talking to someone)? 1  Has anyone observed you stop breathing during your sleep? 1  Do you have, or are you being treated for high blood pressure? 1  BMI more than 35 kg/m2? 1  Age > 50 (1-yes) 1  Neck circumference greater than:Female 16 inches or larger, Female 17inches or larger? 0  Female Gender (Yes=1) 0  Obstructive Sleep Apnea Score 6  Score 5 or greater  Results sent to PCP

## 2016-08-21 NOTE — Pre-Procedure Instructions (Signed)
Platelets of 116 shown to Dr Patsey Berthold. No orders given.

## 2016-08-23 ENCOUNTER — Encounter (HOSPITAL_COMMUNITY)
Admission: RE | Disposition: A | Discharge status: Home / Self Care | Payer: Self-pay | Source: Ambulatory Visit | Attending: Internal Medicine

## 2016-08-23 ENCOUNTER — Ambulatory Visit (HOSPITAL_COMMUNITY): Payer: Medicare PPO | Admitting: Anesthesiology

## 2016-08-23 ENCOUNTER — Ambulatory Visit (HOSPITAL_COMMUNITY)
Admission: RE | Admit: 2016-08-23 | Discharge: 2016-08-23 | Disposition: A | Discharge status: Home / Self Care | Payer: Medicare PPO | Source: Ambulatory Visit | Attending: Internal Medicine | Admitting: Internal Medicine

## 2016-08-23 ENCOUNTER — Encounter (HOSPITAL_COMMUNITY): Payer: Self-pay | Admitting: *Deleted

## 2016-08-23 DIAGNOSIS — K7460 Unspecified cirrhosis of liver: Secondary | ICD-10-CM

## 2016-08-23 DIAGNOSIS — I110 Hypertensive heart disease with heart failure: Secondary | ICD-10-CM | POA: Insufficient documentation

## 2016-08-23 DIAGNOSIS — K7469 Other cirrhosis of liver: Secondary | ICD-10-CM

## 2016-08-23 DIAGNOSIS — F329 Major depressive disorder, single episode, unspecified: Secondary | ICD-10-CM | POA: Insufficient documentation

## 2016-08-23 DIAGNOSIS — I509 Heart failure, unspecified: Secondary | ICD-10-CM | POA: Insufficient documentation

## 2016-08-23 DIAGNOSIS — K7581 Nonalcoholic steatohepatitis (NASH): Secondary | ICD-10-CM | POA: Insufficient documentation

## 2016-08-23 DIAGNOSIS — I8500 Esophageal varices without bleeding: Secondary | ICD-10-CM | POA: Insufficient documentation

## 2016-08-23 DIAGNOSIS — Z981 Arthrodesis status: Secondary | ICD-10-CM | POA: Insufficient documentation

## 2016-08-23 DIAGNOSIS — K766 Portal hypertension: Secondary | ICD-10-CM

## 2016-08-23 DIAGNOSIS — Z7983 Long term (current) use of bisphosphonates: Secondary | ICD-10-CM | POA: Insufficient documentation

## 2016-08-23 DIAGNOSIS — Z7982 Long term (current) use of aspirin: Secondary | ICD-10-CM | POA: Insufficient documentation

## 2016-08-23 DIAGNOSIS — M810 Age-related osteoporosis without current pathological fracture: Secondary | ICD-10-CM | POA: Insufficient documentation

## 2016-08-23 DIAGNOSIS — I8510 Secondary esophageal varices without bleeding: Secondary | ICD-10-CM | POA: Insufficient documentation

## 2016-08-23 DIAGNOSIS — Z853 Personal history of malignant neoplasm of breast: Secondary | ICD-10-CM | POA: Insufficient documentation

## 2016-08-23 DIAGNOSIS — K219 Gastro-esophageal reflux disease without esophagitis: Secondary | ICD-10-CM | POA: Insufficient documentation

## 2016-08-23 DIAGNOSIS — K3189 Other diseases of stomach and duodenum: Secondary | ICD-10-CM | POA: Insufficient documentation

## 2016-08-23 DIAGNOSIS — E1140 Type 2 diabetes mellitus with diabetic neuropathy, unspecified: Secondary | ICD-10-CM | POA: Insufficient documentation

## 2016-08-23 DIAGNOSIS — E785 Hyperlipidemia, unspecified: Secondary | ICD-10-CM | POA: Insufficient documentation

## 2016-08-23 DIAGNOSIS — Z794 Long term (current) use of insulin: Secondary | ICD-10-CM | POA: Insufficient documentation

## 2016-08-23 HISTORY — PX: ESOPHAGEAL BANDING: SHX5518

## 2016-08-23 HISTORY — PX: ESOPHAGOGASTRODUODENOSCOPY (EGD) WITH PROPOFOL: SHX5813

## 2016-08-23 LAB — GLUCOSE, CAPILLARY
GLUCOSE-CAPILLARY: 151 mg/dL — AB (ref 65–99)
Glucose-Capillary: 196 mg/dL — ABNORMAL HIGH (ref 65–99)

## 2016-08-23 SURGERY — ESOPHAGOGASTRODUODENOSCOPY (EGD) WITH PROPOFOL
Anesthesia: Monitor Anesthesia Care

## 2016-08-23 MED ORDER — PROPOFOL 10 MG/ML IV BOLUS
INTRAVENOUS | Status: AC
  Filled 2016-08-23: qty 60

## 2016-08-23 MED ORDER — PROPOFOL 500 MG/50ML IV EMUL
INTRAVENOUS | Status: DC | PRN
  Administered 2016-08-23: 150 ug/kg/min via INTRAVENOUS
  Administered 2016-08-23: 100 ug/kg/min via INTRAVENOUS

## 2016-08-23 MED ORDER — LIDOCAINE VISCOUS 2 % MT SOLN: 5.0000 mL | Freq: Once | OROMUCOSAL | Status: DC

## 2016-08-23 MED ORDER — LIDOCAINE VISCOUS 2 % MT SOLN
OROMUCOSAL | Status: AC
  Filled 2016-08-23: qty 15

## 2016-08-23 MED ORDER — SIMETHICONE 40 MG/0.6ML PO SUSP
ORAL | Status: AC
  Filled 2016-08-23: qty 0.6

## 2016-08-23 MED ORDER — FENTANYL CITRATE (PF) 100 MCG/2ML IJ SOLN
25.0000 ug | Freq: Once | INTRAMUSCULAR | Status: AC
  Administered 2016-08-23: 25 ug via INTRAVENOUS

## 2016-08-23 MED ORDER — MIDAZOLAM HCL 2 MG/2ML IJ SOLN
INTRAMUSCULAR | Status: AC
  Filled 2016-08-23: qty 2

## 2016-08-23 MED ORDER — MIDAZOLAM HCL 2 MG/2ML IJ SOLN
1.0000 mg | INTRAMUSCULAR | Status: AC
  Administered 2016-08-23 (×2): 1 mg via INTRAVENOUS

## 2016-08-23 MED ORDER — FENTANYL CITRATE (PF) 100 MCG/2ML IJ SOLN
INTRAMUSCULAR | Status: AC
  Filled 2016-08-23: qty 2

## 2016-08-23 MED ORDER — PROPOFOL 10 MG/ML IV BOLUS
INTRAVENOUS | Status: DC | PRN
  Administered 2016-08-23: 20 mg via INTRAVENOUS

## 2016-08-23 MED ORDER — CHLORHEXIDINE GLUCONATE CLOTH 2 % EX PADS: 6.0000 | MEDICATED_PAD | Freq: Once | CUTANEOUS | Status: DC

## 2016-08-23 MED ORDER — LACTATED RINGERS IV SOLN
INTRAVENOUS | Status: DC
  Administered 2016-08-23: 11:00:00 via INTRAVENOUS

## 2016-08-23 NOTE — H&P (Signed)
Savannah Miles is an 61 y.o. female.   Chief Complaint: Patient is here for EGD and possible esophageal variceal banding. 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 She is undergoing EGD to evaluate and treat esophageal varices. She denies nausea vomiting abdominal pain melena or rectal bleeding. She states she has not lost any weight. She is seeing dietitian. She has changed eating habits. She is trying to be more active.  Past Medical History:  Diagnosis Date  . Arthritis   . Borderline glaucoma   . 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 Cancer [facility])    breast cancer  . CHF (congestive heart failure) ([place])   . Depression   . Diabetes mellitus, type II ([place])   . GERD (gastroesophageal reflux disease)   . Heart murmur   . Hyperlipidemia   . Hypertension   . Kidney disease   . Neuropathy   . Osteoporosis   . PONV (postoperative nausea and vomiting)     Past Surgical History:  Procedure Laterality Date  . BREAST LUMPECTOMY Left   . COLONOSCOPY WITH PROPOFOL N/A 10/27/2015   Procedure: COLONOSCOPY WITH PROPOFOL;  Surgeon: Rogene Houston, MD;  Location: [facility] ENDO SUITE;  Service: Endoscopy;  Laterality: N/A;  100 - moved to 8:30  . DILATION AND CURETTAGE OF UTERUS    . ORIF FOOT FRACTURE Left   . OTHER SURGICAL HISTORY Left Leg, Arm, Hip fx   otif, has rods and screws in each.  Marland Kitchen POLYPECTOMY  10/27/2015   Procedure: POLYPECTOMY;  Surgeon: Rogene Houston, MD;  Location: [facility] ENDO SUITE;  Service: Endoscopy;;  . SPINAL FUSION    . TRACHEOSTOMY      Family History  Problem Relation Age of Onset  . Cancer Mother   . Cancer Father   . Diabetes Sister   . Cancer Maternal Grandfather   . Cancer Paternal Grandmother    Social History:  reports that she has never smoked. She has never used smokeless tobacco. She reports that she does not drink alcohol or use drugs.  Allergies:   Allergies  Allergen Reactions  . Codeine Other (See Comments)    dizziness    Medications Prior to Admission  Medication Sig Dispense Refill  . amLODipine (NORVASC) 10 MG tablet Take 10 mg by mouth daily.    Marland Kitchen aspirin 81 MG chewable tablet Chew 81 mg by mouth daily.    Marland Kitchen atorvastatin (LIPITOR) 20 MG tablet Take 20 mg by mouth daily.    Marland Kitchen buPROPion (WELLBUTRIN) 75 MG tablet Take 75 mg by mouth daily.    . cephALEXin (KEFLEX) 500 MG capsule TAKE 4 CAPSULES BY MOUTH 1 HOUR BEFORE DENTAL APPOINTMENT  3  . citalopram (CELEXA) 40 MG tablet Take 40 mg by mouth daily.    Marland Kitchen denosumab (PROLIA) 60 MG/ML SOLN injection Inject 60 mg into the skin every 6 (six) months. Administer in upper arm, thigh, or abdomen  Patient states that she gets twice a year.     . gabapentin (NEURONTIN) 800 MG tablet Take 800 mg by mouth 5 (five) times daily.     Marland Kitchen glucose blood (ACCU-CHEK GUIDE) test strip 1 each by Other route 4 (four) times daily. Use as instructed. E11.65 450 each 1  . HYDROcodone-acetaminophen (NORCO/VICODIN) 5-325 MG tablet Take 1 tablet by mouth every 6 (six) hours as needed (for pain).     . insulin aspart (NOVOLOG) 100  UNIT/ML injection Inject 18-24 Units into the skin 3 (three) times daily with meals. (Patient taking differently: Inject 18-24 Units into the skin 3 (three) times daily with meals. Sliding scale) 70 mL 0  . insulin detemir (LEVEMIR) 100 UNIT/ML injection Inject 0.8 mLs (80 Units total) into the skin at bedtime. 70 mL 0  . losartan (COZAAR) 100 MG tablet Take 100 mg by mouth daily.    . metFORMIN (GLUCOPHAGE) 500 MG tablet Take 1 tablet (500 mg total) by mouth 2 (two) times daily with a meal. 180 tablet 0  . metoprolol (LOPRESSOR) 50 MG tablet Take 50 mg by mouth 2 (two) times daily.    Marland Kitchen omeprazole (PRILOSEC) 20 MG capsule Take 20 mg by mouth daily before breakfast.  3  . potassium chloride SA (K-DUR,KLOR-CON) 20 MEQ tablet Take 1 tablet (20 mEq total) by mouth daily. (Patient  taking differently: Take 20 mEq by mouth 2 (two) times daily. ) 30 tablet 1  . torsemide (DEMADEX) 20 MG tablet Take 20-40 mg by mouth 2 (two) times daily. 40 mg (2 tablets) in the morning & 20 mg (1 tablet) in the afternoon with lunch  0  . Vitamin D, Ergocalciferol, (DRISDOL) 50000 units CAPS capsule Take 50,000 Units by mouth 2 (two) times a week. Wednesday & Saturday.    Marland Kitchen albuterol (PROVENTIL HFA;VENTOLIN HFA) 108 (90 Base) MCG/ACT inhaler Inhale 1-2 puffs into the lungs as needed for wheezing or shortness of breath.    . nitroGLYCERIN (NITROSTAT) 0.4 MG SL tablet USE 1 TAB SUBLINGUALLY EVERY 5 MINUTES FOR CHEST PAIN AS NEEDED  0    Results for orders placed or performed during the hospital encounter of 08/23/16 (from the past 48 hour(s))  Glucose, capillary     Status: Abnormal   Collection Time: 08/23/16  9:34 AM  Result Value Ref Range   Glucose-Capillary 151 (H) 65 - 99 mg/dL   No results found.  ROS  Pulse (!) 59, temperature 97.8 F (36.6 C), temperature source Oral, resp. rate (!) 24. Physical Exam  Constitutional:  Well-developed obese Caucasian female in NAD. She does not have asterixis.  HENT:  Mouth/Throat: Oropharynx is clear and moist.  Eyes: Conjunctivae are normal. No scleral icterus.  Neck: No thyromegaly present.  Cardiovascular:  Cardiac exam with regular rhythm normal S1 and S2. Loud holosystolic murmur noted all over the precordium. It is best heard at left sternal border.  Respiratory: Effort normal and breath sounds normal.  GI:  Abdomen is full. It is soft and nontender without organomegaly or masses.  Musculoskeletal: She exhibits edema (2+ edema noted to both legs.).  Lymphadenopathy:    She has no cervical adenopathy.  Neurological: She is alert.  Skin: Skin is warm and dry.     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 EGD to screen for and treat esophageal varices.  Hildred Laser,  MD 08/23/2016, 10:14 AM

## 2016-08-23 NOTE — Progress Notes (Signed)
Limited discharge instructions discussed with patient's friend Damaris Schooner, per patient request.

## 2016-08-23 NOTE — Transfer of Care (Signed)
Immediate Anesthesia Transfer of Care Note  Patient: Savannah Miles  Procedure(s) Performed: Procedure(s) with comments: ESOPHAGOGASTRODUODENOSCOPY (EGD) WITH PROPOFOL (N/A) - 11:00 ESOPHAGEAL BANDING (N/A)  Patient Location: PACU  Anesthesia Type:MAC  Level of Consciousness: awake and patient cooperative  Airway & Oxygen Therapy: Patient Spontanous Breathing and Patient connected to nasal cannula oxygen  Post-op Assessment: Report given to RN and Post -op Vital signs reviewed and stable  Post vital signs: Reviewed and stable  Last Vitals:  Vitals:   08/23/16 1000 08/23/16 1100  BP: 133/62 131/61  Pulse:    Resp: 12 13  Temp:      Last Pain:  Vitals:   08/23/16 0946  TempSrc: Oral  PainSc: 7       Patients Stated Pain Goal: 2 (53/20/23 3435)  Complications: No apparent anesthesia complications

## 2016-08-23 NOTE — Anesthesia Preprocedure Evaluation (Signed)
Anesthesia Evaluation  Patient identified by MRN, date of birth, ID band Patient awake    Reviewed: Allergy & Precautions, NPO status , Patient's Chart, lab work & pertinent test results, reviewed documented beta blocker date and time   History of Anesthesia Complications (+) PONV and history of anesthetic complications  Airway Mallampati: I  TM Distance: >3 FB     Dental  (+) Teeth Intact, Caps,    Pulmonary neg pulmonary ROS,  Healed old trach scar.   breath sounds clear to auscultation       Cardiovascular hypertension, Pt. on medications and Pt. on home beta blockers +CHF   Rhythm:Regular Rate:Normal     Neuro/Psych PSYCHIATRIC DISORDERS (hx severe depression with suicide attempt.) Depression    GI/Hepatic GERD  ,  Endo/Other  diabetes, Type 2  Renal/GU      Musculoskeletal   Abdominal   Peds  Hematology   Anesthesia Other Findings   Reproductive/Obstetrics                             Anesthesia Physical Anesthesia Plan  ASA: III  Anesthesia Plan: MAC   Post-op Pain Management:    Induction: Intravenous  Airway Management Planned: Simple Face Mask  Additional Equipment:   Intra-op Plan:   Post-operative Plan:   Informed Consent: I have reviewed the patients History and Physical, chart, labs and discussed the procedure including the risks, benefits and alternatives for the proposed anesthesia with the patient or authorized representative who has indicated his/her understanding and acceptance.     Plan Discussed with:   Anesthesia Plan Comments:         Anesthesia Quick Evaluation

## 2016-08-23 NOTE — Anesthesia Procedure Notes (Signed)
Procedure Name: MAC Date/Time: 08/23/2016 11:38 AM Performed by: Vista Deck Pre-anesthesia Checklist: Patient identified, Emergency Drugs available, Suction available, Timeout performed and Patient being monitored Patient Re-evaluated:Patient Re-evaluated prior to inductionOxygen Delivery Method: Non-rebreather mask

## 2016-08-23 NOTE — Anesthesia Postprocedure Evaluation (Signed)
Anesthesia Post Note  Patient: Savannah Miles  Procedure(s) Performed: Procedure(s) (LRB): ESOPHAGOGASTRODUODENOSCOPY (EGD) WITH PROPOFOL (N/A) ESOPHAGEAL BANDING (N/A)  Patient location during evaluation: PACU Anesthesia Type: MAC Level of consciousness: awake and alert Pain management: satisfactory to patient Vital Signs Assessment: post-procedure vital signs reviewed and stable Respiratory status: spontaneous breathing Cardiovascular status: stable Anesthetic complications: no     Last Vitals:  Vitals:   08/23/16 1230 08/23/16 1243  BP: 137/61 (!) 129/54  Pulse: (!) 58 60  Resp: 15 16  Temp:  37 C    Last Pain:  Vitals:   08/23/16 1243  TempSrc: Oral  PainSc:                  Drucie Opitz

## 2016-08-23 NOTE — Discharge Instructions (Addendum)
Aspirin or NSAIDs for 5 days. Resume other medications as before. Full liquids today and usual diet starting tomorrow morning. No driving for 24 hours. Office visit as planned.   Esophageal Varices The esophagus is the passage that connects the throat to the stomach. Esophageal varices are blood vessels in the esophagus that have become enlarged. They develop when extra blood is forced to flow through them because the blood's normal pathway is blocked. Without treatment these blood vessels eventually break and bleed (hemorrhage). A hemorrhage is life-threatening. What are the causes? This condition may be caused by:  Scarring of the liver (cirrhosis) due to alcoholism. This is the most common cause.  Liver disease.  Severe heart failure.  A blood clot in the portal vein.  Sarcoidosis. This is an inflammatory disease that can affect the liver.  Schistosomiasis. This is a parasitic infection that can cause liver damage. What are the signs or symptoms? Usually there are no symptoms unless the esophageal varices bleed. Symptoms of bleeding esophageal varices include:  Vomiting material that is bright red or that is black and looks like coffee grounds.  Coughing up blood.  Black, tarry stools.  Dizziness or lightheadedness.  Low blood pressure.  Loss of consciousness. How is this diagnosed? This condition is diagnosed with tests, such as:  Endoscopy. During this test a thin, lighted tube is inserted through the mouth and into the esophagus.  Blood tests. These may be done to check liver function, blood counts, and the body's ability to form blood clots. How is this treated? This condition may be treated with:  Medicines that reduce pressure in the esophageal varices and reduce the risk of bleeding.  Procedures to reduce pressure in the esophageal varices and reduce the risk of bleeding or stop bleeding. These include:  Variceal ligation. In this procedure, a rubber band  is placed around the esophageal varices to keep them from bleeding.  Injection therapy. This treatment involves an injection that causes the esophageal varices to shrink and close (sclerotherapy). Medicines that tighten blood vessels or alter blood flow may also be used.  Balloon tamponade. In this procedure, a tube is put into the esophagus and a balloon is passed through it and inflated.  Transjugular intrahepatic portosystemic shunt (TIPS) placement. In this procedure, a small tube is placed within the liver veins. This decreases blood flow and pressure to the esophageal varices.  A liver transplant. This may be done if other treatments do not work. Follow these instructions at home:  Take medicines only as directed by your health care provider.  Follow your health care provider's instructions about rest and physical activity. Get help right away if:  You have any symptoms of this condition after treatment.  You are unable to eat or drink.  You have chest pain. This information is not intended to replace advice given to you by your health care provider. Make sure you discuss any questions you have with your health care provider. Document Released: 06/08/2003 Document Revised: 08/24/2015 Document Reviewed: 03/14/2014 Elsevier Interactive Patient Education  2017 [place].    Esophagogastroduodenoscopy Esophagogastroduodenoscopy (EGD) is a procedure to examine the lining of the esophagus, stomach, and first part of the small intestine (duodenum). This procedure is done to check for problems such as inflammation, bleeding, ulcers, or growths. During this procedure, a long, flexible, lighted tube with a camera attached (endoscope) is inserted down the throat. Tell a health care provider about:  Any allergies you have.  All medicines you are  taking, including vitamins, herbs, eye drops, creams, and over-the-counter medicines.  Any problems you or family members have had with  anesthetic medicines.  Any blood disorders you have.  Any surgeries you have had.  Any medical conditions you have.  Whether you are pregnant or may be pregnant. What are the risks? Generally, this is a safe procedure. However, problems may occur, including:  Infection.  Bleeding.  A tear (perforation) in the esophagus, stomach, or duodenum.  Trouble breathing.  Excessive sweating.  Spasms of the larynx.  A slowed heartbeat.  Low blood pressure. What happens before the procedure?  Follow instructions from your health care provider about eating or drinking restrictions.  Ask your health care provider about:  Changing or stopping your regular medicines. This is especially important if you are taking diabetes medicines or blood thinners.  Taking medicines such as aspirin and ibuprofen. These medicines can thin your blood. Do not take these medicines before your procedure if your health care provider instructs you not to.  Plan to have someone take you home after the procedure.  If you wear dentures, be ready to remove them before the procedure. What happens during the procedure?  To reduce your risk of infection, your health care team will wash or sanitize their hands.  An IV tube will be put in a vein in your hand or arm. You will get medicines and fluids through this tube.  You will be given one or more of the following:  A medicine to help you relax (sedative).  A medicine to numb the area (local anesthetic). This medicine may be sprayed into your throat. It will make you feel more comfortable and keep you from gagging or coughing during the procedure.  A medicine for pain.  A mouth guard may be placed in your mouth to protect your teeth and to keep you from biting on the endoscope.  You will be asked to lie on your left side.  The endoscope will be lowered down your throat into your esophagus, stomach, and duodenum.  Air will be put into the endoscope.  This will help your health care provider see better.  The lining of your esophagus, stomach, and duodenum will be examined.  Your health care provider may:  Take a tissue sample so it can be looked at in a lab (biopsy).  Remove growths.  Remove objects (foreign bodies) that are stuck.  Treat any bleeding with medicines or other devices that stop tissue from bleeding.  Widen (dilate) or stretch narrowed areas of your esophagus and stomach.  The endoscope will be taken out. The procedure may vary among health care providers and hospitals. What happens after the procedure?  Your blood pressure, heart rate, breathing rate, and blood oxygen level will be monitored often until the medicines you were given have worn off.  Do not eat or drink anything until the numbing medicine has worn off and your gag reflex has returned. This information is not intended to replace advice given to you by your health care provider. Make sure you discuss any questions you have with your health care provider. Document Released: 07/19/2004 Document Revised: 08/24/2015 Document Reviewed: 02/09/2015 Elsevier Interactive Patient Education  2017 [employer].

## 2016-08-23 NOTE — Op Note (Signed)
[facility] Patient Name: Savannah Miles Procedure Date: 08/23/2016 11:29 AM MRN: [id number] Date of Birth: [birth year] Attending MD: Hildred Laser , MD CSN: [id number] Age: 61 Admit Type: Outpatient Procedure:                Upper GI endoscopy Indications:              Cirrhosis rule out esophageal varices Providers:                Hildred Laser, MD, Hinton Rao, RN, Bonnetta Barry,                            Technician Referring MD:             Kennieth Rad, MD Medicines:                Lidocaine spray, Propofol per Anesthesia Complications:            No immediate complications. Estimated Blood Loss:     Estimated blood loss: none. Procedure:                Pre-Anesthesia Assessment:                           - Prior to the procedure, a History and Physical                            was performed, and patient medications and                            allergies were reviewed. The patient's tolerance of                            previous anesthesia was also reviewed. The risks                            and benefits of the procedure and the sedation                            options and risks were discussed with the patient.                            All questions were answered, and informed consent                            was obtained. Prior Anticoagulants: The patient                            last took aspirin 2 days prior to the procedure.                            ASA Grade Assessment: III - A patient with severe                            systemic disease. After reviewing the risks and  benefits, the patient was deemed in satisfactory                            condition to undergo the procedure.                           After obtaining informed consent, the endoscope was                            passed under direct vision. Throughout the                            procedure, the patient's blood pressure, pulse, and          oxygen saturations were monitored continuously. The                            [id number] ([id number]) scope was introduced through the                            and advanced to the second part of duodenum. The                            upper GI endoscopy was accomplished without                            difficulty. The patient tolerated the procedure                            well. Scope In: 11:49:02 AM Scope Out: 12:05:53 PM Total Procedure Duration: 0 hours 16 minutes 51 seconds  Findings:      The proximal esophagus and mid esophagus were normal.      Grade I, grade III varices were found in the distal esophagus. Two bands       were successfully placed with complete eradication, resulting in       deflation of varices. There was no bleeding during, and at the end, of       the procedure.      Moderate portal hypertensive gastropathy was found in the gastric       fundus, in the gastric body and in the gastric antrum.      The exam of the stomach was otherwise normal.      The duodenal bulb and second portion of the duodenum were normal. Impression:               - Normal proximal esophagus and mid esophagus.                           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 Large column completely                            eradicated by applying two bands.                           -  Portal hypertensive gastropathy.                           - Normal duodenal bulb and second portion of the                            duodenum.                           - No specimens collected. Moderate Sedation:      Per Anesthesia Care Recommendation:           - Patient has a contact number available for                            emergencies. The signs and symptoms of potential                            delayed complications were discussed with the                            patient. Return to normal activities tomorrow.                             Written discharge instructions were provided to the                            patient.                           - Clear liquid diet today.                           - Continue present medications.                           - No aspirin, ibuprofen, naproxen, or other                            non-steroidal anti-inflammatory drugs for 5 days.                           - Return to GI clinic in 3 months.                           - Repeat upper endoscopy in 6 months. Procedure Code(s):        --- Professional ---                           [id number], Esophagogastroduodenoscopy, flexible,                            transoral; with band ligation of esophageal/gastric                            varices Diagnosis Code(s):        --- Professional ---  K74.60, Unspecified cirrhosis of liver                           I85.10, Secondary esophageal varices without                            bleeding                           K76.6, Portal hypertension                           K31.89, Other diseases of stomach and duodenum CPT copyright 2016 [employer]. All rights reserved. The codes documented in this report are preliminary and upon coder review may  be revised to meet current compliance requirements. Hildred Laser, MD Hildred Laser, MD 08/23/2016 12:18:44 PM This report has been signed electronically. Number of Addenda: 0

## 2016-08-27 ENCOUNTER — Encounter (INDEPENDENT_AMBULATORY_CARE_PROVIDER_SITE_OTHER): Payer: Self-pay | Admitting: Internal Medicine

## 2016-08-27 ENCOUNTER — Ambulatory Visit (INDEPENDENT_AMBULATORY_CARE_PROVIDER_SITE_OTHER): Payer: Medicare PPO | Admitting: Internal Medicine

## 2016-08-27 ENCOUNTER — Encounter (INDEPENDENT_AMBULATORY_CARE_PROVIDER_SITE_OTHER): Payer: Self-pay

## 2016-08-27 VITALS — BP 140/70 | HR 68 | Temp 97.9°F | Resp 18 | Ht 67.0 in | Wt 242.3 lb

## 2016-08-27 DIAGNOSIS — R0789 Other chest pain: Secondary | ICD-10-CM

## 2016-08-27 MED ORDER — SUCRALFATE 1 GM/10ML PO SUSP
1.0000 g | Freq: Four times a day (QID) | ORAL | 1 refills | Status: DC
Start: 2016-08-27 — End: 2016-08-27

## 2016-08-27 MED ORDER — OMEPRAZOLE 20 MG PO CPDR: 20.0000 mg | DELAYED_RELEASE_CAPSULE | Freq: Two times a day (BID) | ORAL | 0 refills | Status: DC

## 2016-08-27 MED ORDER — SUCRALFATE 1 GM/10ML PO SUSP: 1.0000 g | Freq: Four times a day (QID) | ORAL | 1 refills | Status: DC

## 2016-08-27 NOTE — Patient Instructions (Addendum)
Can stop sucralfate after 2 weeks. Continue omeprazole 20 mg twice daily for 1 month and then drop dose back to once a day. Decrease citalopram dose to 20 mg daily while on double dose omeprazole.

## 2016-08-27 NOTE — Progress Notes (Signed)
Presenting complaint;  Chest pain and dysphagia.  Database and Subjective:  Patient is 61 year old Caucasian female was cirrhosis secondary to NASH who underwent esophageal variceal banding on 08/23/2016 for primary prophylaxis. Only one column was large enough to be banded. It was banded at 2 levels. Gas and have chest pain and dysphagia one day later. She regurgitated liquids few times and she also has been having chest pain and heartburn. Feels better today. She has not experienced hematemesis melena abdominal pain shortness of breath or fever.   Current Medications: Outpatient Encounter Prescriptions as of 08/27/2016  Medication Sig  . albuterol (PROVENTIL HFA;VENTOLIN HFA) 108 (90 Base) MCG/ACT inhaler Inhale 1-2 puffs into the lungs as needed for wheezing or shortness of breath.  Marland Kitchen amLODipine (NORVASC) 10 MG tablet Take 10 mg by mouth daily.  Marland Kitchen aspirin 81 MG chewable tablet Chew 1 tablet (81 mg total) by mouth daily.  Marland Kitchen atorvastatin (LIPITOR) 20 MG tablet Take 20 mg by mouth daily.  Marland Kitchen buPROPion (WELLBUTRIN) 75 MG tablet Take 75 mg by mouth daily.  . cephALEXin (KEFLEX) 500 MG capsule TAKE 4 CAPSULES BY MOUTH 1 HOUR BEFORE DENTAL APPOINTMENT  . citalopram (CELEXA) 40 MG tablet Take 40 mg by mouth daily.  Marland Kitchen denosumab (PROLIA) 60 MG/ML SOLN injection Inject 60 mg into the skin every 6 (six) months. Administer in upper arm, thigh, or abdomen  Patient states that she gets twice a year.   . gabapentin (NEURONTIN) 800 MG tablet Take 800 mg by mouth 5 (five) times daily.   Marland Kitchen glucose blood (ACCU-CHEK GUIDE) test strip 1 each by Other route 4 (four) times daily. Use as instructed. E11.65  . HYDROcodone-acetaminophen (NORCO/VICODIN) 5-325 MG tablet Take 1 tablet by mouth every 6 (six) hours as needed (for pain).   . insulin aspart (NOVOLOG) 100 UNIT/ML injection Inject 18-24 Units into the skin 3 (three) times daily with meals. (Patient taking differently: Inject 18-24 Units into the skin 3  (three) times daily with meals. Sliding scale)  . insulin detemir (LEVEMIR) 100 UNIT/ML injection Inject 0.8 mLs (80 Units total) into the skin at bedtime.  Marland Kitchen losartan (COZAAR) 100 MG tablet Take 100 mg by mouth daily.  . metFORMIN (GLUCOPHAGE) 500 MG tablet Take 1 tablet (500 mg total) by mouth 2 (two) times daily with a meal.  . metoprolol (LOPRESSOR) 50 MG tablet Take 50 mg by mouth 2 (two) times daily.  . nitroGLYCERIN (NITROSTAT) 0.4 MG SL tablet USE 1 TAB SUBLINGUALLY EVERY 5 MINUTES FOR CHEST PAIN AS NEEDED  . omeprazole (PRILOSEC) 20 MG capsule Take 1 capsule (20 mg total) by mouth 2 (two) times daily before a meal.  . potassium chloride SA (K-DUR,KLOR-CON) 20 MEQ tablet Take 1 tablet (20 mEq total) by mouth daily. (Patient taking differently: Take 20 mEq by mouth 2 (two) times daily. )  . torsemide (DEMADEX) 20 MG tablet Take 20-40 mg by mouth 2 (two) times daily. 40 mg (2 tablets) in the morning & 20 mg (1 tablet) in the afternoon with lunch  . Vitamin D, Ergocalciferol, (DRISDOL) 50000 units CAPS capsule Take 50,000 Units by mouth 2 (two) times a week. Wednesday & Saturday.  . [DISCONTINUED] omeprazole (PRILOSEC) 20 MG capsule Take 20 mg by mouth daily before breakfast.  . [DISCONTINUED] omeprazole (PRILOSEC) 20 MG capsule Take 1 capsule (20 mg total) by mouth daily before breakfast.  .    . [DISCONTINUED] sucralfate (CARAFATE) 1 GM/10ML suspension Take 10 mLs (1 g total) by mouth 4 (four) times  daily.   No facility-administered encounter medications on file as of 08/27/2016.      Objective: Blood pressure 140/70, pulse 68, temperature 97.9 F (36.6 C), temperature source Oral, resp. rate 18, height 5\' 7"  (1.702 m), weight 242 lb 4.8 oz (109.9 kg). Patient is alert and in no acute distress. Asterixis is absent. Conjunctiva is pink. Sclera is nonicteric Oropharyngeal mucosa is normal. No neck masses or thyromegaly noted. Cardiac exam with regular rhythm normal S1 and S2. Loud  systolic murmur best heard at left sternal border. Lungs are clear to auscultation. Abdomen is obese but soft and nontender without organomegaly or masses. Into 2+ pitting edema involving both legs.   Assessment:  #1. Chest pain and dysphagia secondary to esophageal variceal banding. She is already feeling better. Would benefit from few weeks of double dose PPI and sucralfate. She also needs to stay on soft foods for 2-3 days.   Plan:  Hold aspirin for another 3 days. Reason omeprazole to 20 mg by mouth twice a day for 4 weeks and thereafter once daily. Sucralfate suspension 1 g by mouth before meals and daily at bedtime for 10 days. Office visit in 6 months.

## 2016-08-30 ENCOUNTER — Encounter (HOSPITAL_COMMUNITY): Payer: Self-pay | Admitting: Internal Medicine

## 2016-09-13 IMAGING — US US ABDOMEN COMPLETE
1 series · 13 of 25 positions shown · non-contrast
Comparison: None.

CLINICAL DATA: Hepatic steatosis. Leukopenia. Thrombocytopenia.
History of breast cancer.

EXAM:
ABDOMEN ULTRASOUND COMPLETE

[Series 1: us abdomen complete · 0.28mm/px · 13 of 99 slices shown]
[im 1/99]
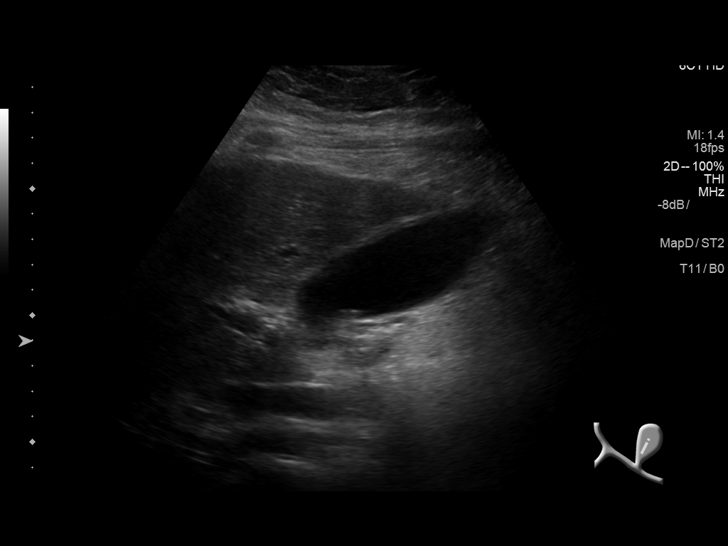
[im 9/99]
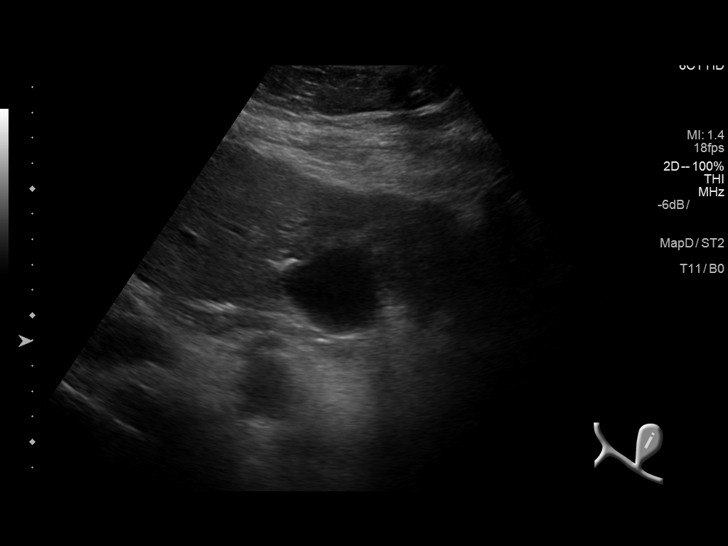
[im 17/99]
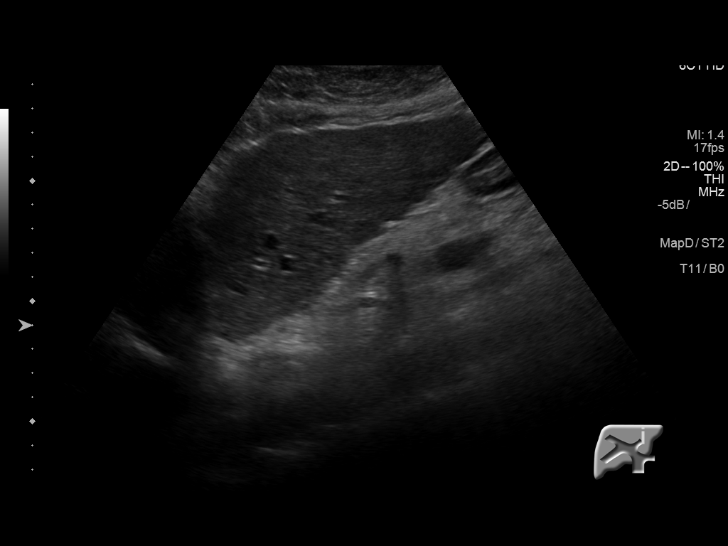
[im 25/99]
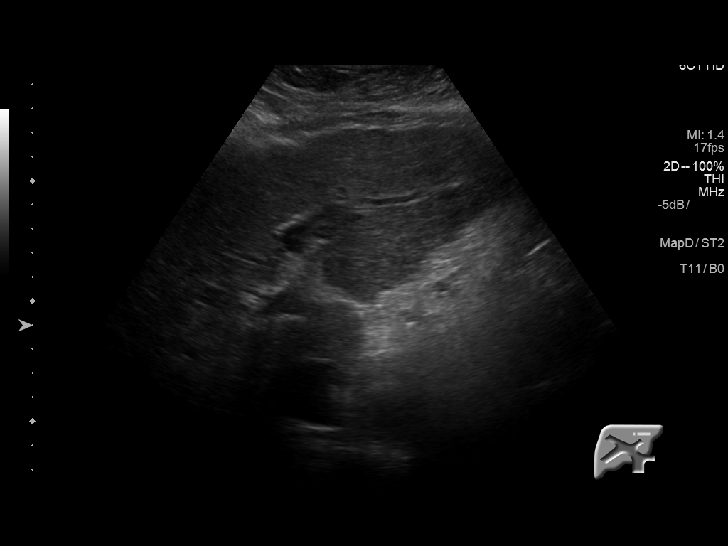
[im 33/99]
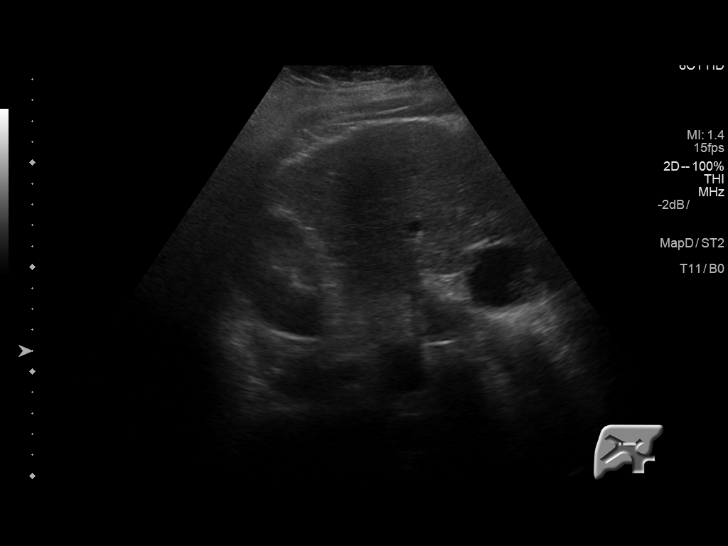
[im 41/99]
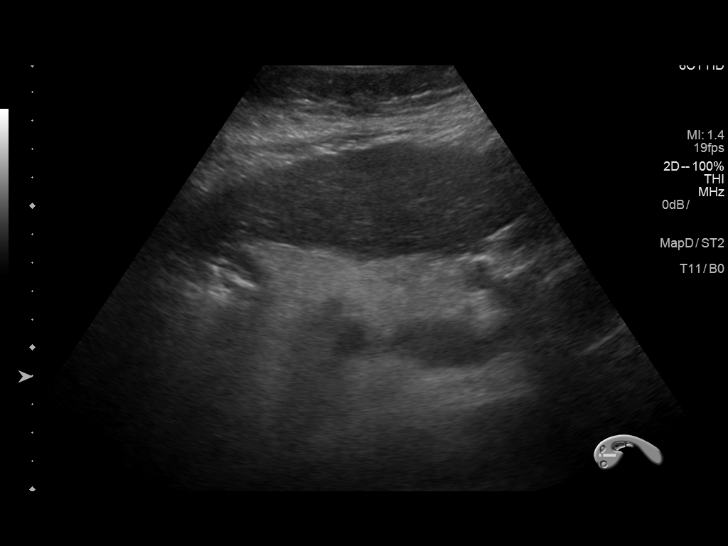
[im 50/99]
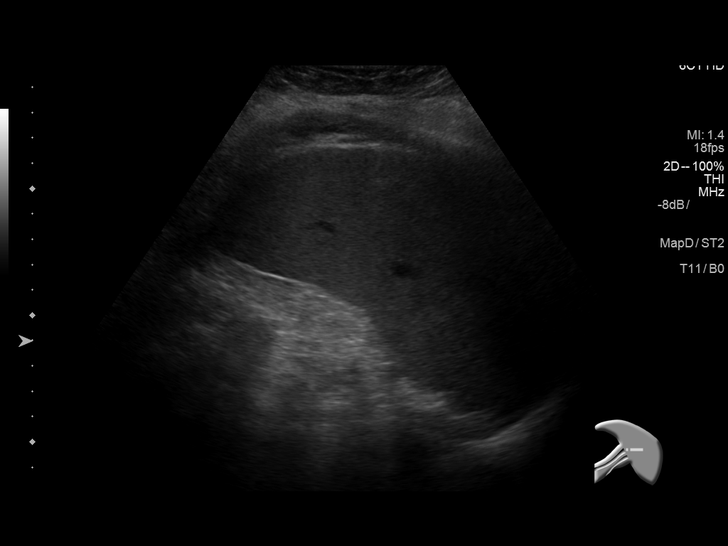
[im 58/99]
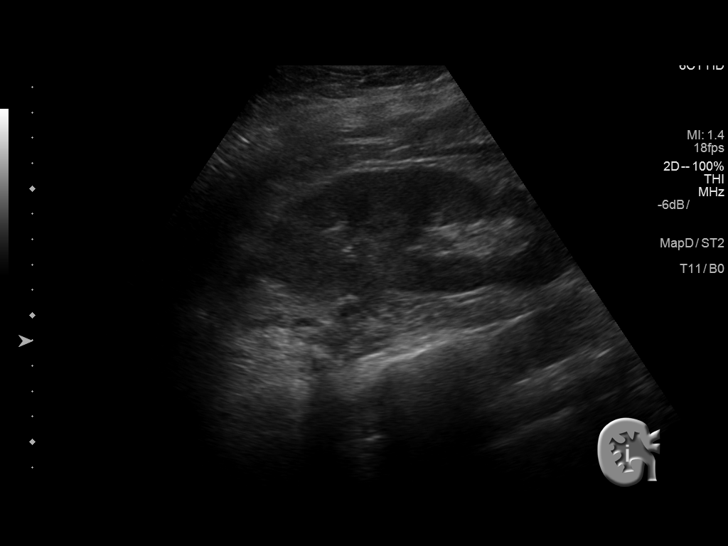
[im 66/99]
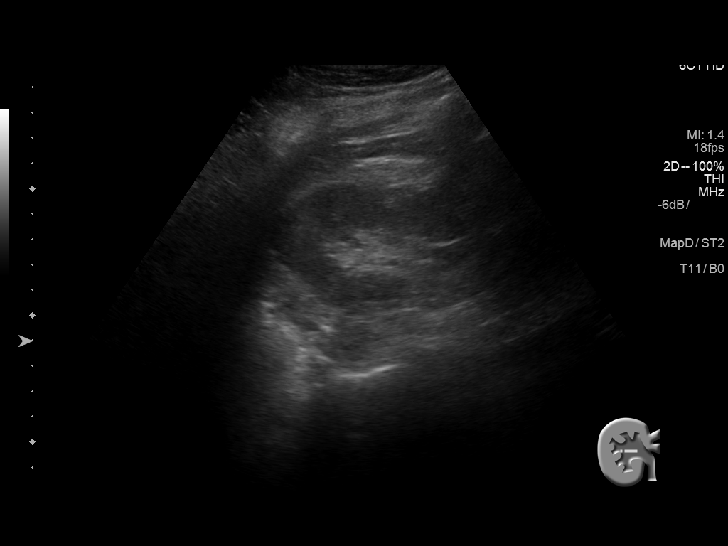
[im 74/99]
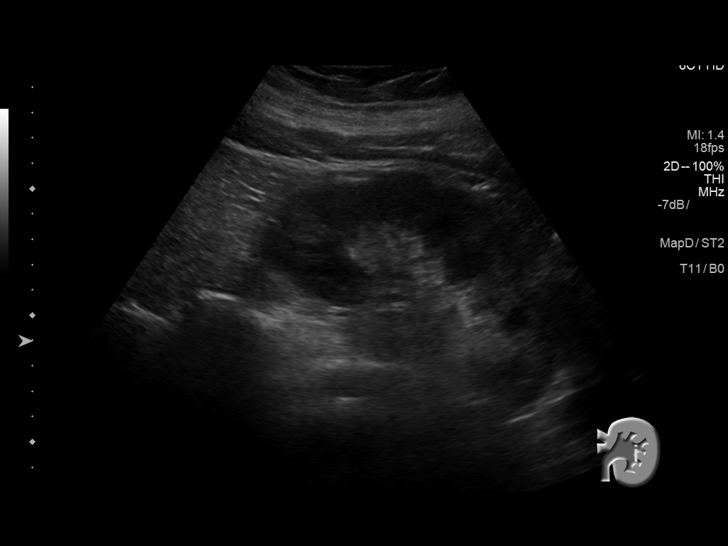
[im 82/99]
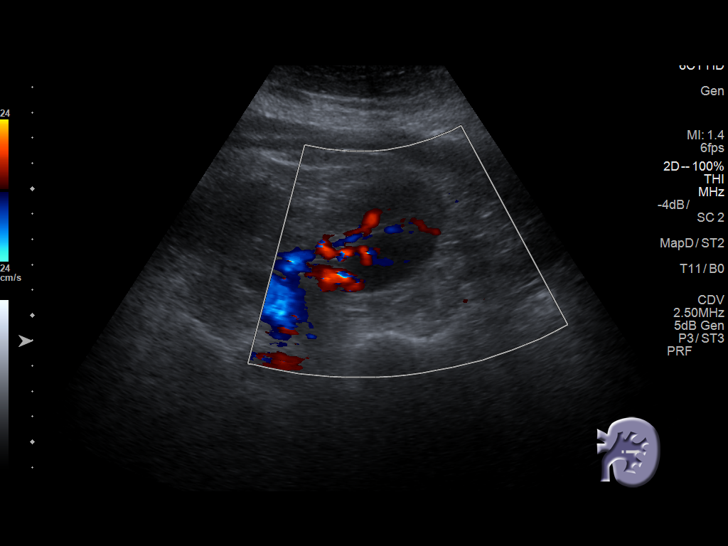
[im 90/99]
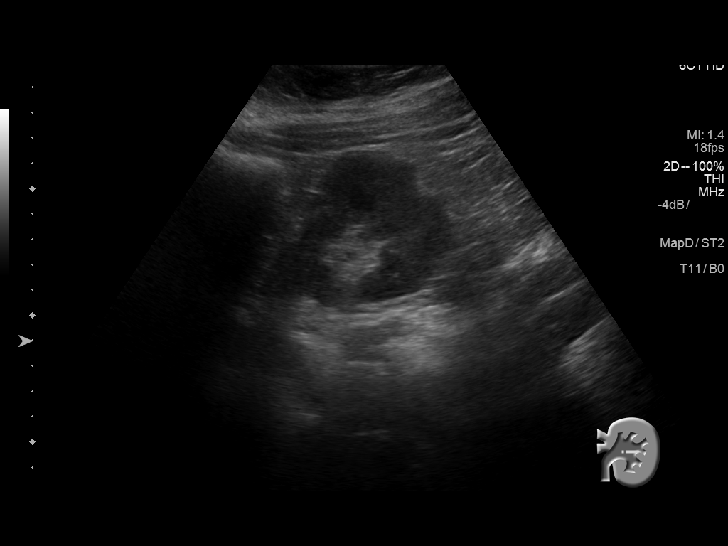
[im 99/99]
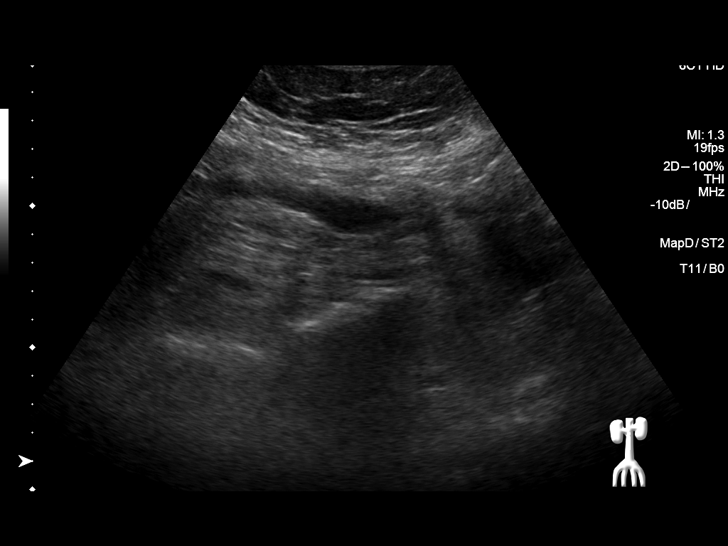

[13 of 25 positions shown; findings below may reference images not displayed]

FINDINGS: Gallbladder: Nondistended gallbladder contains a solitary layering 7
mm gallstone, with no definite gallbladder wall thickening,
pericholecystic fluid or sonographic Murphy's sign.

Common bile duct: Diameter: 6 mm, upper normal.

Liver: Liver parenchyma is diffusely mildly coarsened in echotexture
in the liver surface is diffusely irregular, consistent with
cirrhosis. No liver mass is demonstrated.

IVC: No abnormality visualized.

Pancreas: Visualized portion unremarkable.

Spleen: Mild-to-moderate splenomegaly. No splenic mass. Splenic
dimensions 16.6 x 15.8 x 8.5 cm (volume = 1159.3 cc).

Right Kidney: Length: 14.5 cm. Echogenicity within normal limits. No
mass or hydronephrosis visualized.

Left Kidney: Length: 13.5 cm. Echogenicity within normal limits. No
mass or hydronephrosis visualized. A punctate 2 mm echogenic focus
in the mid to upper left renal sinus could represent a non shadowing
nonobstructing stone.

Abdominal aorta: No aneurysm visualized.

Other findings: No upper abdominal ascites.
IMPRESSION: 1. Cirrhosis.  No liver mass.
2. Mild-to-moderate splenomegaly.  No upper abdominal ascites.
3. Cholelithiasis. No evidence of acute cholecystitis. Common bile
duct diameter 6 mm, upper normal.
4. Possible punctate nonobstructing left renal stone. No
hydronephrosis.

## 2016-09-23 ENCOUNTER — Other Ambulatory Visit (INDEPENDENT_AMBULATORY_CARE_PROVIDER_SITE_OTHER): Payer: Self-pay | Admitting: Internal Medicine

## 2016-10-01 ENCOUNTER — Other Ambulatory Visit: Payer: Self-pay

## 2016-10-01 MED ORDER — INSULIN ASPART 100 UNIT/ML ~~LOC~~ SOLN: 19.0000 [IU] | Freq: Three times a day (TID) | SUBCUTANEOUS | 0 refills | Status: DC

## 2016-10-01 MED ORDER — INSULIN DETEMIR 100 UNIT/ML ~~LOC~~ SOLN: 80.0000 [IU] | Freq: Every day | SUBCUTANEOUS | 0 refills | Status: DC

## 2016-10-14 ENCOUNTER — Other Ambulatory Visit: Payer: Self-pay | Admitting: "Endocrinology

## 2016-10-14 ENCOUNTER — Ambulatory Visit: Payer: Medicare PPO | Admitting: "Endocrinology

## 2016-10-14 ENCOUNTER — Other Ambulatory Visit (INDEPENDENT_AMBULATORY_CARE_PROVIDER_SITE_OTHER): Payer: Self-pay | Admitting: Internal Medicine

## 2016-10-14 LAB — LIPID PANEL
CHOLESTEROL: 214 — AB (ref 0–200)
HDL: 69 (ref 35–70)
LDL Cholesterol: 120
Triglycerides: 123 (ref 40–160)

## 2016-10-14 LAB — HEMOGLOBIN A1C: Hemoglobin A1C: 7.4

## 2016-10-14 LAB — BASIC METABOLIC PANEL
BUN: 18 (ref 4–21)
CREATININE: 0.8 (ref <=1.1)

## 2016-11-19 ENCOUNTER — Ambulatory Visit: Payer: Medicare PPO | Admitting: "Endocrinology

## 2016-12-11 ENCOUNTER — Ambulatory Visit (INDEPENDENT_AMBULATORY_CARE_PROVIDER_SITE_OTHER): Payer: Medicare PPO | Admitting: "Endocrinology

## 2016-12-11 ENCOUNTER — Encounter: Payer: Self-pay | Admitting: "Endocrinology

## 2016-12-11 VITALS — BP 142/71 | HR 66 | Ht 67.0 in | Wt 236.0 lb

## 2016-12-11 DIAGNOSIS — E6609 Other obesity due to excess calories: Secondary | ICD-10-CM | POA: Diagnosis not present

## 2016-12-11 DIAGNOSIS — E1165 Type 2 diabetes mellitus with hyperglycemia: Secondary | ICD-10-CM | POA: Diagnosis not present

## 2016-12-11 DIAGNOSIS — Z794 Long term (current) use of insulin: Secondary | ICD-10-CM | POA: Diagnosis not present

## 2016-12-11 DIAGNOSIS — IMO0002 Reserved for concepts with insufficient information to code with codable children: Secondary | ICD-10-CM

## 2016-12-11 DIAGNOSIS — E118 Type 2 diabetes mellitus with unspecified complications: Secondary | ICD-10-CM

## 2016-12-11 DIAGNOSIS — I10 Essential (primary) hypertension: Secondary | ICD-10-CM | POA: Diagnosis not present

## 2016-12-11 DIAGNOSIS — IMO0001 Reserved for inherently not codable concepts without codable children: Secondary | ICD-10-CM

## 2016-12-11 DIAGNOSIS — E782 Mixed hyperlipidemia: Secondary | ICD-10-CM | POA: Diagnosis not present

## 2016-12-11 DIAGNOSIS — Z6837 Body mass index (BMI) 37.0-37.9, adult: Secondary | ICD-10-CM | POA: Diagnosis not present

## 2016-12-11 MED ORDER — INSULIN ASPART 100 UNIT/ML ~~LOC~~ SOLN: 10.0000 [IU] | Freq: Three times a day (TID) | SUBCUTANEOUS | 2 refills | Status: DC

## 2016-12-11 MED ORDER — INSULIN DETEMIR 100 UNIT/ML ~~LOC~~ SOLN: 60.0000 [IU] | Freq: Every day | SUBCUTANEOUS | 2 refills | Status: DC

## 2016-12-11 MED ORDER — FREESTYLE LIBRE READER DEVI: 1.0000 | Freq: Once | 0 refills | Status: AC

## 2016-12-11 MED ORDER — FREESTYLE LIBRE SENSOR SYSTEM MISC: 2 refills | Status: DC

## 2016-12-11 NOTE — Patient Instructions (Signed)

## 2016-12-11 NOTE — Progress Notes (Signed)
Subjective:    Patient ID: Savannah Miles, female    DOB: December 28, 1955. Patient is being seen in Follow-up for management of diabetes requested by  Kennieth Rad, MD  Past Medical History:  Diagnosis Date  . Arthritis   . Borderline glaucoma   . 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 Cancer [facility])    breast cancer  . CHF (congestive heart failure) ([place])   . Depression   . Diabetes mellitus, type II ([place])   . GERD (gastroesophageal reflux disease)   . Heart murmur   . Hyperlipidemia   . Hypertension   . Kidney disease   . Neuropathy   . Osteoporosis   . PONV (postoperative nausea and vomiting)    Past Surgical History:  Procedure Laterality Date  . BREAST LUMPECTOMY Left   . COLONOSCOPY WITH PROPOFOL N/A 10/27/2015   Procedure: COLONOSCOPY WITH PROPOFOL;  Surgeon: Rogene Houston, MD;  [facility];  Service: Endoscopy;  Laterality: N/A;  100 - moved to 8:30  . DILATION AND CURETTAGE OF UTERUS    . ESOPHAGEAL BANDING N/A 08/23/2016   Procedure: ESOPHAGEAL BANDING;  Surgeon: Rogene Houston, MD;  [facility];  Service: Endoscopy;  Laterality: N/A;  . ESOPHAGOGASTRODUODENOSCOPY (EGD) WITH PROPOFOL N/A 08/23/2016   Procedure: ESOPHAGOGASTRODUODENOSCOPY (EGD) WITH PROPOFOL;  Surgeon: Rogene Houston, MD;  [facility];  Service: Endoscopy;  Laterality: N/A;  11:00  . ORIF FOOT FRACTURE Left   . OTHER SURGICAL HISTORY Left Leg, Arm, Hip fx   otif, has rods and screws in each.  Marland Kitchen POLYPECTOMY  10/27/2015   Procedure: POLYPECTOMY;  Surgeon: Rogene Houston, MD;  [facility];  Service: Endoscopy;;  . SPINAL FUSION    . TRACHEOSTOMY     Social History   Social History  . Marital status: Divorced    Spouse name: N/A  . Number of children: N/A  . Years of education: N/A   Social History Main Topics  . Smoking status: Never Smoker  . Smokeless tobacco: Never Used  . Alcohol use No  . Drug use: No  . Sexual activity: Yes    Birth  control/ protection: Post-menopausal   Other Topics Concern  . None   Social History Narrative  . None   Outpatient Encounter Prescriptions as of 12/11/2016  Medication Sig  . albuterol (PROVENTIL HFA;VENTOLIN HFA) 108 (90 Base) MCG/ACT inhaler Inhale 1-2 puffs into the lungs as needed for wheezing or shortness of breath.  Marland Kitchen amLODipine (NORVASC) 10 MG tablet Take 10 mg by mouth daily.  Marland Kitchen aspirin 81 MG chewable tablet Chew 1 tablet (81 mg total) by mouth daily.  Marland Kitchen atorvastatin (LIPITOR) 20 MG tablet Take 20 mg by mouth daily.  Marland Kitchen buPROPion (WELLBUTRIN) 75 MG tablet Take 75 mg by mouth daily.  . cephALEXin (KEFLEX) 500 MG capsule TAKE 4 CAPSULES BY MOUTH 1 HOUR BEFORE DENTAL APPOINTMENT  . citalopram (CELEXA) 40 MG tablet Take 40 mg by mouth daily.  . Continuous Blood Gluc Receiver (FREESTYLE LIBRE READER) DEVI 1 Piece by Does not apply route once.  . Continuous Blood Gluc Sensor (FREESTYLE LIBRE SENSOR SYSTEM) MISC Use one sensor every 10 days.  Marland Kitchen denosumab (PROLIA) 60 MG/ML SOLN injection Inject 60 mg into the skin every 6 (six) months. Administer in upper arm, thigh, or abdomen  Patient states that she gets twice a year.   . gabapentin (NEURONTIN) 800 MG tablet Take 800 mg by mouth  5 (five) times daily.   Marland Kitchen glucose blood (ACCU-CHEK GUIDE) test strip 1 each by Other route 4 (four) times daily. Use as instructed. E11.65  . HYDROcodone-acetaminophen (NORCO/VICODIN) 5-325 MG tablet Take 1 tablet by mouth every 6 (six) hours as needed (for pain).   . insulin aspart (NOVOLOG) 100 UNIT/ML injection Inject 10-16 Units into the skin 3 (three) times daily with meals.  . insulin detemir (LEVEMIR) 100 UNIT/ML injection Inject 0.6 mLs (60 Units total) into the skin at bedtime.  Marland Kitchen losartan (COZAAR) 100 MG tablet Take 100 mg by mouth daily.  . metFORMIN (GLUCOPHAGE) 500 MG tablet TAKE 1 TABLET TWICE DAILY WITH A MEAL  . metoprolol (LOPRESSOR) 50 MG tablet Take 50 mg by mouth 2 (two) times daily.  .  nitroGLYCERIN (NITROSTAT) 0.4 MG SL tablet USE 1 TAB SUBLINGUALLY EVERY 5 MINUTES FOR CHEST PAIN AS NEEDED  . omeprazole (PRILOSEC) 20 MG capsule Take 1 capsule (20 mg total) by mouth daily before breakfast.  . potassium chloride SA (K-DUR,KLOR-CON) 20 MEQ tablet Take 1 tablet (20 mEq total) by mouth daily. (Patient taking differently: Take 20 mEq by mouth 2 (two) times daily. )  . sucralfate (CARAFATE) 1 GM/10ML suspension Take 10 mLs (1 g total) by mouth 4 (four) times daily.  Marland Kitchen torsemide (DEMADEX) 20 MG tablet Take 20-40 mg by mouth 2 (two) times daily. 40 mg (2 tablets) in the morning & 20 mg (1 tablet) in the afternoon with lunch  . Vitamin D, Ergocalciferol, (DRISDOL) 50000 units CAPS capsule Take 50,000 Units by mouth 2 (two) times a week. Wednesday & Saturday.  . [DISCONTINUED] insulin aspart (NOVOLOG) 100 UNIT/ML injection Inject 19-25 Units into the skin 3 (three) times daily with meals.  . [DISCONTINUED] insulin detemir (LEVEMIR) 100 UNIT/ML injection Inject 0.8 mLs (80 Units total) into the skin at bedtime.   No facility-administered encounter medications on file as of 12/11/2016.    ALLERGIES: Allergies  Allergen Reactions  . Acyclovir And Related   . Codeine Other (See Comments)    dizziness   VACCINATION STATUS:  There is no immunization history on file for this patient.  Diabetes  She presents for her follow-up diabetic visit. She has type 2 diabetes mellitus. Onset time: Patient was diagnosed at approximate age of 41 years. Her disease course has been improving. There are no hypoglycemic associated symptoms. Pertinent negatives for hypoglycemia include no confusion, headaches, pallor or seizures. Associated symptoms include fatigue and polydipsia. Pertinent negatives for diabetes include no chest pain, no polyphagia, no polyuria and no weight loss. There are no hypoglycemic complications. Symptoms are improving. There are no diabetic complications. Risk factors for coronary  artery disease include diabetes mellitus, dyslipidemia, hypertension and sedentary lifestyle. Current diabetic treatment includes insulin injections and oral agent (monotherapy) (She is on toujeo 38 units in the morning and 68 units at bedtime, Humalog 28 units 3 times a day with meals. She is also on metformin 1000 mg by mouth twice a day.). Her weight is decreasing steadily. She is following a generally unhealthy diet. She has not had a previous visit with a dietitian. She participates in exercise intermittently. Her overall blood glucose range is 130-140 mg/dl. (She has monitored  1-2 times a day last 30 days averaging 136. Her A1c has improved to 7.4%.  She did not bring her logs to review.) An ACE inhibitor/angiotensin II receptor blocker is being taken.  Hyperlipidemia  This is a chronic problem. The current episode started more than 1 year ago.  Pertinent negatives include no chest pain, myalgias or shortness of breath. Current antihyperlipidemic treatment includes statins. Risk factors for coronary artery disease include diabetes mellitus, dyslipidemia, hypertension, obesity and a sedentary lifestyle.  Hypertension  This is a chronic problem. The current episode started more than 1 year ago. Pertinent negatives include no chest pain, headaches, palpitations or shortness of breath. Risk factors for coronary artery disease include diabetes mellitus, dyslipidemia, obesity and sedentary lifestyle. Past treatments include angiotensin blockers.    Review of Systems  Constitutional: Positive for fatigue. Negative for chills, fever, unexpected weight change and weight loss.  HENT: Negative for trouble swallowing and voice change.   Eyes: Negative for visual disturbance.  Respiratory: Negative for cough, shortness of breath and wheezing.   Cardiovascular: Negative for chest pain, palpitations and leg swelling.  Gastrointestinal: Negative for diarrhea, nausea and vomiting.  Endocrine: Positive for  polydipsia. Negative for cold intolerance, heat intolerance, polyphagia and polyuria.  Musculoskeletal: Negative for arthralgias and myalgias.  Skin: Negative for color change, pallor, rash and wound.  Neurological: Negative for seizures and headaches.  Psychiatric/Behavioral: Negative for confusion and suicidal ideas.    Objective:    BP (!) 142/71   Pulse 66   Ht 5\' 7"  (1.702 m)   Wt 236 lb (107 kg)   BMI 36.96 kg/m   Wt Readings from Last 3 Encounters:  12/11/16 236 lb (107 kg)  08/27/16 242 lb 4.8 oz (109.9 kg)  08/20/16 249 lb (112.9 kg)    Physical Exam  Constitutional: She is oriented to person, place, and time. She appears well-developed.  HENT:  Head: Normocephalic and atraumatic.  Eyes: EOM are normal.  Neck: Normal range of motion. Neck supple. No tracheal deviation present. No thyromegaly present.  Cardiovascular: Normal rate and regular rhythm.   Murmur heard. She has systolic murmur which radiates to the left side of the neck. She is following with cardiology had echocardiogram in the last 2 years. Records not available to review. I advised her to continue follow up closely with cardiologist.   Pulmonary/Chest: Effort normal and breath sounds normal.  Abdominal: Soft. Bowel sounds are normal. There is no tenderness. There is no guarding.  Musculoskeletal: Normal range of motion. She exhibits no edema.  Neurological: She is alert and oriented to person, place, and time. She has normal reflexes. No cranial nerve deficit. Coordination normal.  Skin: Skin is warm and dry. No rash noted. No erythema. No pallor.  Psychiatric: She has a normal mood and affect. Judgment normal.   Recent Results (from the past 2160 hour(s))  Basic metabolic panel     Status: None   Collection Time: 10/14/16 12:00 AM  Result Value Ref Range   BUN 18 4 - 21   Creatinine 0.8 0.5 - 1.1  Lipid panel     Status: Abnormal   Collection Time: 10/14/16 12:00 AM  Result Value Ref Range    Triglycerides 123 40 - 160   Cholesterol 214 (A) 0 - 200   HDL 69 35 - 70   LDL Cholesterol 120   Hemoglobin A1c     Status: None   Collection Time: 10/14/16 12:00 AM  Result Value Ref Range   Hemoglobin A1C 7.4       Diabetic Labs (most recent): Lab Results  Component Value Date   HGBA1C 7.4 10/14/2016   HGBA1C 9.8 07/03/2016   HGBA1C 12.3 02/29/2016    Assessment & Plan:   1. Uncontrolled type 2 diabetes mellitus with complication, with  long-term current use of insulin ([place])  - Patient has currently uncontrolled symptomatic type 2 DM since  61 years of age. - She did better since last visit .  She returns with improving A1c of 7.4%, slowly improving from 12.3% .  She has monitored  less than 50% of recommended, average blood glucose 136 in the last 14 days.  - Recent labs reviewed.   Her diabetes is complicated by obesity and patient remains at a high risk for more acute and chronic complications of diabetes which include CAD, CVA, CKD, retinopathy, and neuropathy. These are all discussed in detail with the patient.  - I have counseled the patient on diet management and weight loss, by adopting a carbohydrate restricted/protein rich diet.  - Suggestion is made for her to avoid simple carbohydrates  from her diet including Cakes, Sweet Desserts, Ice Cream, Soda (diet and regular), Sweet Tea, Candies, Chips, Cookies, Store Bought Juices, Alcohol in Excess of  1-2 drinks a day, Artificial Sweeteners, and "Sugar-free" Products. This will help patient to have stable blood glucose profile and potentially avoid unintended weight gain.   - I encouraged the patient to switch to  unprocessed or minimally processed complex starch and increased protein intake (animal or plant source), fruits, and vegetables.  - Patient is advised to stick to a routine mealtimes to eat 3 meals  a day and avoid unnecessary snacks ( to snack only to correct hypoglycemia).   - I have approached patient  with the following individualized plan to manage diabetes and patient agrees:    I  urged her to resume  and continue basal/ bolus insulin.   - I advised her to decrease Levemir to 60 units daily at bedtime, increase NovoLog to 10 units   for pre-meal BG readings of 90-150mg /dl, plus patient specific correction dose for unexpected hyperglycemia above 150mg /dl, associated with strict monitoring of glucose   4 times a day-before meals and at bedtime.  - She'll benefit from continuous glucose monitoring. I discussed and initiated a prescription for the [facility] device.  - Patient is warned not to take insulin without proper monitoring per orders. -Adjustment parameters are given for hypo and hyperglycemia in writing. -Patient is encouraged to call clinic for blood glucose levels less than 70 or above 300 mg /dl. - I will continue metformin to 500 mg by mouth twice a day, therapeutically suitable for patient.  - Patient specific target  A1c;  LDL, HDL, Triglycerides, and  Waist Circumference were discussed in detail.  2) BP/HTN: uncontrolled. Continue current medications including ACEI/ARB. 3) Lipids/HPL: uncontrolled LDL RISING TO 120 FROM 72 ,  she is advised to continue statins. 4)  Weight/Diet:   patient is obese, CDE Consult has been initiated , exercise, and detailed carbohydrates information provided.  5) Chronic Care/Health Maintenance:  -Patient is on ACEI/ARB and Statin medications and encouraged to continue to follow up with Ophthalmology, Podiatrist at least yearly or according to recommendations, and advised to   stay away from smoking. I have recommended yearly flu vaccine and pneumonia vaccination at least every 5 years; moderate intensity exercise for up to 150 minutes weekly; and  sleep for at least 7 hours a day.  - Time spent with the patient: 25 min, of which >50% was spent in reviewing her sugar logs , discussing her hypo- and hyper-glycemic episodes, reviewing her  current and  previous labs and insulin doses and developing a plan to avoid hypo- and hyper-glycemia.  - I advised  patient to maintain close follow up with Kennieth Rad, MD for primary care needs.  Follow up plan: - Return in about 3 months (around 03/12/2017) for meter, and logs.  Glade Lloyd, MD Phone: [phone]  Fax: [phone]  This note was partially dictated with voice recognition software. Similar sounding words can be transcribed inadequately or may not  be corrected upon review.  12/11/2016, 12:01 PM

## 2016-12-31 ENCOUNTER — Ambulatory Visit (INDEPENDENT_AMBULATORY_CARE_PROVIDER_SITE_OTHER): Payer: Medicare PPO | Admitting: Internal Medicine

## 2016-12-31 LAB — HM DIABETES EYE EXAM

## 2017-01-02 ENCOUNTER — Other Ambulatory Visit: Payer: Self-pay

## 2017-01-02 MED ORDER — INSULIN DETEMIR 100 UNIT/ML ~~LOC~~ SOLN: 60.0000 [IU] | Freq: Every day | SUBCUTANEOUS | 2 refills | Status: DC

## 2017-01-23 ENCOUNTER — Other Ambulatory Visit: Payer: Self-pay | Admitting: "Endocrinology

## 2017-01-23 ENCOUNTER — Other Ambulatory Visit: Payer: Self-pay

## 2017-01-23 MED ORDER — INSULIN ASPART 100 UNIT/ML ~~LOC~~ SOLN: 10.0000 [IU] | Freq: Three times a day (TID) | SUBCUTANEOUS | 2 refills | Status: DC

## 2017-02-04 ENCOUNTER — Other Ambulatory Visit: Payer: Self-pay

## 2017-02-04 MED ORDER — INSULIN ASPART 100 UNIT/ML ~~LOC~~ SOLN: 10.0000 [IU] | Freq: Three times a day (TID) | SUBCUTANEOUS | 0 refills | Status: AC

## 2017-02-13 LAB — LIPID PANEL
CHOLESTEROL: 234 — AB (ref 0–200)
HDL: 63 (ref 35–70)
LDL CALC: 146
Triglycerides: 123 (ref 40–160)

## 2017-02-13 LAB — HEMOGLOBIN A1C: Hemoglobin A1C: 6.7

## 2017-02-13 LAB — BASIC METABOLIC PANEL
BUN: 23 — AB (ref 4–21)
Creatinine: 1.1 (ref <=1.1)

## 2017-02-25 ENCOUNTER — Ambulatory Visit (INDEPENDENT_AMBULATORY_CARE_PROVIDER_SITE_OTHER): Payer: Medicare PPO | Admitting: Internal Medicine

## 2017-02-25 ENCOUNTER — Encounter (INDEPENDENT_AMBULATORY_CARE_PROVIDER_SITE_OTHER): Payer: Self-pay | Admitting: Internal Medicine

## 2017-03-11 ENCOUNTER — Ambulatory Visit (INDEPENDENT_AMBULATORY_CARE_PROVIDER_SITE_OTHER): Payer: Medicare PPO | Admitting: Internal Medicine

## 2017-03-13 ENCOUNTER — Ambulatory Visit (INDEPENDENT_AMBULATORY_CARE_PROVIDER_SITE_OTHER): Payer: Medicare PPO | Admitting: Internal Medicine

## 2017-03-13 ENCOUNTER — Ambulatory Visit: Payer: Medicare PPO | Admitting: "Endocrinology

## 2017-03-18 ENCOUNTER — Encounter (INDEPENDENT_AMBULATORY_CARE_PROVIDER_SITE_OTHER): Payer: Self-pay | Admitting: *Deleted

## 2017-03-18 ENCOUNTER — Ambulatory Visit (INDEPENDENT_AMBULATORY_CARE_PROVIDER_SITE_OTHER): Payer: Medicare PPO | Admitting: Internal Medicine

## 2017-03-18 ENCOUNTER — Encounter (INDEPENDENT_AMBULATORY_CARE_PROVIDER_SITE_OTHER): Payer: Self-pay

## 2017-03-18 ENCOUNTER — Other Ambulatory Visit (INDEPENDENT_AMBULATORY_CARE_PROVIDER_SITE_OTHER): Payer: Self-pay | Admitting: Internal Medicine

## 2017-03-18 ENCOUNTER — Encounter (INDEPENDENT_AMBULATORY_CARE_PROVIDER_SITE_OTHER): Payer: Self-pay | Admitting: Internal Medicine

## 2017-03-18 VITALS — BP 160/78 | HR 72 | Temp 98.0°F | Ht 67.0 in | Wt 236.0 lb

## 2017-03-18 DIAGNOSIS — I8510 Secondary esophageal varices without bleeding: Secondary | ICD-10-CM | POA: Insufficient documentation

## 2017-03-18 DIAGNOSIS — K7469 Other cirrhosis of liver: Secondary | ICD-10-CM

## 2017-03-18 DIAGNOSIS — K648 Other hemorrhoids: Secondary | ICD-10-CM

## 2017-03-18 DIAGNOSIS — K7460 Unspecified cirrhosis of liver: Secondary | ICD-10-CM

## 2017-03-18 MED ORDER — HYDROCORTISONE ACE-PRAMOXINE 1-1 % RE FOAM: 1.0000 | Freq: Two times a day (BID) | RECTAL | 0 refills | Status: DC

## 2017-03-18 NOTE — Progress Notes (Addendum)
Subjective:    Patient ID: Savannah Miles, female    DOB: July 26, 1955, 61 y.o.   MRN: [id number] Wt in March 241 HPI Here today for f/u. Last seen in March by Dr. Laural Golden. Hx of cirrhoisis, splenomegaly and cholelithiasis.  I Iron studies ruled out hemochromatosis. Hepatitis B surface antigen and HCV antibody were negative. AFP was normal. ANA was positive at a low titer but smooth muscle antibody was negative. Antimitochondrial antibody was negative. She has never been vaccinated for Hep A and B.  No family hx of liver disease. She tells me she is doing okay. She has has no GI complaints. She is going to physical therapy from a fall. She broke her left shoulder. She has lost 7 pound since May which was intentional.  She also c/o hemorrhoids and is requesting an Rx.   02/29/2016 Hepatitis B surface antigen non-reactive. Hep C antibody  Non reactive.    08/26/2016 EGD: cirrhosis. Rule out esophageal varices.   Impression:               - Normal proximal esophagus and mid esophagus.                           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 Large column completely                            eradicated by applying two bands.                           - Portal hypertensive gastropathy.                           - Normal duodenal bulb and second portion of the  duodenum. Repeat EGD in 6 months.  10/14/2016 HA1C 7.4  CBC    Component Value Date/Time   WBC 4.4 08/20/2016 1018   RBC 3.87 08/20/2016 1018   HGB 11.9 (L) 08/20/2016 1018   HCT 36.4 08/20/2016 1018   PLT 116 (L) 08/20/2016 1018   MCV 94.1 08/20/2016 1018   MCH 30.7 08/20/2016 1018   MCHC 32.7 08/20/2016 1018   RDW 13.5 08/20/2016 1018   LYMPHSABS 0.8 10/20/2015 1015   MONOABS 0.3 10/20/2015 1015   EOSABS 0.1 10/20/2015 1015   BASOSABS 0.0 10/20/2015 1015   Hepatic Function Panel  CMP Latest Ref Rng & Units 10/14/2016 08/20/2016 10/20/2015  Glucose 65 - 99  mg/dL - 137(H) 247(H)  BUN 4 - 21 18 19 15   Creatinine 0.5 - 1.1 0.8 0.89 0.66  Sodium 135 - 145 mmol/L - 140 137  Potassium 3.5 - 5.1 mmol/L - 3.8 4.3  Chloride 101 - 111 mmol/L - 105 105  CO2 22 - 32 mmol/L - 27 27  Calcium 8.9 - 10.3 mg/dL - 8.9 8.5(L)   11/26/2015   11/09/2015 US abdomen: Hepatic steatosis. Leukopenia, thrombocytopenia.  IMPRESSION: 1. Cirrhosis.  No liver mass. 2. Mild-to-moderate splenomegaly.  No upper abdominal ascites. 3. Cholelithiasis. No evidence of acute cholecystitis. Common bile duct diameter 6 mm, upper normal. 4. Possible punctate nonobstructing left renal stone. No hydronephrosis.       Review of  Systems Past Medical History:  Diagnosis Date  . Arthritis   . Borderline glaucoma   . 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 Cancer [facility])    breast cancer  . CHF (congestive heart failure) ([place])   . Depression   . Diabetes mellitus, type II ([place])   . GERD (gastroesophageal reflux disease)   . Heart murmur   . Hyperlipidemia   . Hypertension   . Kidney disease   . Neuropathy   . Osteoporosis   . PONV (postoperative nausea and vomiting)     Past Surgical History:  Procedure Laterality Date  . BREAST LUMPECTOMY Left   . COLONOSCOPY WITH PROPOFOL N/A 10/27/2015   Procedure: COLONOSCOPY WITH PROPOFOL;  Surgeon: Rogene Houston, MD;  [facility];  Service: Endoscopy;  Laterality: N/A;  100 - moved to 8:30  . DILATION AND CURETTAGE OF UTERUS    . ESOPHAGEAL BANDING N/A 08/23/2016   Procedure: ESOPHAGEAL BANDING;  Surgeon: Rogene Houston, MD;  [facility];  Service: Endoscopy;  Laterality: N/A;  . ESOPHAGOGASTRODUODENOSCOPY (EGD) WITH PROPOFOL N/A 08/23/2016   Procedure: ESOPHAGOGASTRODUODENOSCOPY (EGD) WITH PROPOFOL;  Surgeon: Rogene Houston, MD;  [facility];  Service: Endoscopy;  Laterality: N/A;  11:00  . ORIF FOOT FRACTURE Left   . OTHER SURGICAL HISTORY Left Leg, Arm, Hip fx   otif, has rods and screws in  each.  Marland Kitchen POLYPECTOMY  10/27/2015   Procedure: POLYPECTOMY;  Surgeon: Rogene Houston, MD;  [facility];  Service: Endoscopy;;  . SPINAL FUSION    . TRACHEOSTOMY      Allergies  Allergen Reactions  . Acyclovir And Related   . Codeine Other (See Comments)    dizziness    Current Outpatient Medications on File Prior to Visit  Medication Sig Dispense Refill  . albuterol (PROVENTIL HFA;VENTOLIN HFA) 108 (90 Base) MCG/ACT inhaler Inhale 1-2 puffs into the lungs as needed for wheezing or shortness of breath.    Marland Kitchen atorvastatin (LIPITOR) 20 MG tablet Take 20 mg by mouth daily.    Marland Kitchen denosumab (PROLIA) 60 MG/ML SOLN injection Inject 60 mg into the skin every 6 (six) months. Administer in upper arm, thigh, or abdomen  Patient states that she gets twice a year.     . gabapentin (NEURONTIN) 800 MG tablet Take 800 mg by mouth 5 (five) times daily.     . hydrALAZINE (APRESOLINE) 25 MG tablet Take 25 mg by mouth 2 (two) times daily.    Marland Kitchen HYDROcodone-acetaminophen (NORCO/VICODIN) 5-325 MG tablet Take 1 tablet by mouth every 6 (six) hours as needed (for pain).     . insulin aspart (NOVOLOG) 100 UNIT/ML injection Inject 10-16 Units 3 (three) times daily with meals into the skin. 40 mL 0  . insulin detemir (LEVEMIR) 100 UNIT/ML injection Inject 0.6 mLs (60 Units total) into the skin at bedtime. 60 mL 2  . insulin detemir (LEVEMIR) 100 UNIT/ML injection Inject 80 Units into the skin at bedtime.    Marland Kitchen losartan (COZAAR) 100 MG tablet Take 100 mg by mouth daily.    . Melatonin-Pyridoxine (MELATIN PO) Take by mouth.    . metFORMIN (GLUCOPHAGE) 500 MG tablet TAKE 1 TABLET TWICE DAILY WITH A MEAL 180 tablet 0  . metoprolol (LOPRESSOR) 50 MG tablet Take 50 mg by mouth 2 (two) times daily.    . nitroGLYCERIN (NITROSTAT) 0.4 MG SL tablet USE 1 TAB SUBLINGUALLY EVERY 5 MINUTES FOR CHEST PAIN AS  NEEDED  0  . omeprazole (PRILOSEC) 20 MG capsule Take 1 capsule (20 mg total) by mouth daily before breakfast. 90  capsule 1  . potassium chloride SA (K-DUR,KLOR-CON) 20 MEQ tablet Take 1 tablet (20 mEq total) by mouth daily. (Patient taking differently: Take 20 mEq by mouth 2 (two) times daily. ) 30 tablet 1  . torsemide (DEMADEX) 20 MG tablet Take 20-40 mg by mouth 2 (two) times daily. 40 mg (2 tablets) in the morning & 20 mg (1 tablet) in the afternoon with lunch  0  . Vitamin D, Ergocalciferol, (DRISDOL) 50000 units CAPS capsule Take 50,000 Units by mouth 2 (two) times a week. Wednesday & Saturday.    . Vitamin D, Ergocalciferol, (DRISDOL) 50000 units CAPS capsule Take 50,000 Units by mouth every 7 (seven) days. Twice a week     No current facility-administered medications on file prior to visit.         Objective:   Physical Exam Blood pressure (!) 160/78, pulse 72, temperature 98 F (36.7 C), height 5\' 7"  (1.702 m), weight 236 lb (107 kg). Alert and oriented. Skin warm and dry. Oral mucosa is moist.   . Sclera anicteric, conjunctivae is pink. Thyroid not enlarged. No cervical lymphadenopathy. Lungs clear. Heart regular rate and rhythm.  Abdomen is soft. Bowel sounds are positive. No hepatomegaly. No abdominal masses felt. No tenderness.  No edema to lower extremities.           Assessment & Plan:  Cirrhosis/Thrombocytopenia. Will schedule  with EGD with banding under propofol. Needs surveillance [place] RUQ. CBC and Hepatic today.  Hemorrhoids : Rx for Proctofoam sent to her pharmacy.

## 2017-03-18 NOTE — Patient Instructions (Signed)
Labs today. Will schedule EGD with possible banding with propofol.

## 2017-03-19 ENCOUNTER — Other Ambulatory Visit (INDEPENDENT_AMBULATORY_CARE_PROVIDER_SITE_OTHER): Payer: Self-pay | Admitting: *Deleted

## 2017-03-19 ENCOUNTER — Telehealth (INDEPENDENT_AMBULATORY_CARE_PROVIDER_SITE_OTHER): Payer: Self-pay | Admitting: Internal Medicine

## 2017-03-19 DIAGNOSIS — I8500 Esophageal varices without bleeding: Secondary | ICD-10-CM

## 2017-03-19 LAB — CBC WITH DIFFERENTIAL/PLATELET
BASOS PCT: 0.4 %
Basophils Absolute: 21 cells/uL (ref 0–200)
Eosinophils Absolute: 80 cells/uL (ref 15–500)
Eosinophils Relative: 1.5 %
HEMATOCRIT: 32.7 % — AB (ref 35.0–45.0)
HEMOGLOBIN: 10.8 g/dL — AB (ref 11.7–15.5)
LYMPHS ABS: 1007 {cells}/uL (ref 850–3900)
MCH: 32 pg (ref 27.0–33.0)
MCHC: 33 g/dL (ref 32.0–36.0)
MCV: 96.7 fL (ref 80.0–100.0)
MPV: 10.9 fL (ref 7.5–12.5)
Monocytes Relative: 7 %
NEUTROS ABS: 3821 {cells}/uL (ref 1500–7800)
Neutrophils Relative %: 72.1 %
Platelets: 87 10*3/uL — ABNORMAL LOW (ref 140–400)
RBC: 3.38 10*6/uL — AB (ref 3.80–5.10)
RDW: 12.9 % (ref 11.0–15.0)
Total Lymphocyte: 19 %
WBC: 5.3 10*3/uL (ref 3.8–10.8)
WBCMIX: 371 {cells}/uL (ref 200–950)

## 2017-03-19 LAB — HEPATIC FUNCTION PANEL
AG RATIO: 1.1 (calc) (ref 1.0–2.5)
ALBUMIN MSPROF: 3 g/dL — AB (ref 3.6–5.1)
ALT: 35 U/L — AB (ref 6–29)
AST: 46 U/L — ABNORMAL HIGH (ref 10–35)
Alkaline phosphatase (APISO): 127 U/L (ref 33–130)
BILIRUBIN TOTAL: 0.7 mg/dL (ref 0.2–1.2)
Bilirubin, Direct: 0.2 mg/dL (ref 0.0–0.2)
Globulin: 2.7 g/dL (calc) (ref 1.9–3.7)
Indirect Bilirubin: 0.5 mg/dL (calc) (ref 0.2–1.2)
TOTAL PROTEIN: 5.7 g/dL — AB (ref 6.1–8.1)

## 2017-03-19 NOTE — Telephone Encounter (Signed)
CBC is noted for 3 weeks. The patient will be sent as a reminder.

## 2017-03-19 NOTE — Telephone Encounter (Signed)
Cbc in 3 weeks

## 2017-03-20 NOTE — Telephone Encounter (Signed)
err

## 2017-03-28 ENCOUNTER — Other Ambulatory Visit (INDEPENDENT_AMBULATORY_CARE_PROVIDER_SITE_OTHER): Payer: Self-pay | Admitting: *Deleted

## 2017-03-28 ENCOUNTER — Encounter (INDEPENDENT_AMBULATORY_CARE_PROVIDER_SITE_OTHER): Payer: Self-pay | Admitting: *Deleted

## 2017-03-28 ENCOUNTER — Ambulatory Visit (HOSPITAL_COMMUNITY)
Admission: RE | Admit: 2017-03-28 | Discharge: 2017-03-28 | Disposition: A | Discharge status: Home / Self Care | Payer: Medicare PPO | Source: Ambulatory Visit | Attending: Internal Medicine | Admitting: Internal Medicine

## 2017-03-28 ENCOUNTER — Ambulatory Visit: Payer: Medicare PPO | Admitting: "Endocrinology

## 2017-03-28 ENCOUNTER — Encounter: Payer: Self-pay | Admitting: "Endocrinology

## 2017-03-28 VITALS — BP 162/73 | HR 63 | Ht 67.0 in | Wt 241.0 lb

## 2017-03-28 DIAGNOSIS — E1165 Type 2 diabetes mellitus with hyperglycemia: Secondary | ICD-10-CM

## 2017-03-28 DIAGNOSIS — I10 Essential (primary) hypertension: Secondary | ICD-10-CM | POA: Diagnosis not present

## 2017-03-28 DIAGNOSIS — R188 Other ascites: Secondary | ICD-10-CM | POA: Insufficient documentation

## 2017-03-28 DIAGNOSIS — Z794 Long term (current) use of insulin: Secondary | ICD-10-CM | POA: Diagnosis not present

## 2017-03-28 DIAGNOSIS — K8020 Calculus of gallbladder without cholecystitis without obstruction: Secondary | ICD-10-CM | POA: Diagnosis not present

## 2017-03-28 DIAGNOSIS — E782 Mixed hyperlipidemia: Secondary | ICD-10-CM | POA: Diagnosis not present

## 2017-03-28 DIAGNOSIS — K7469 Other cirrhosis of liver: Secondary | ICD-10-CM | POA: Diagnosis not present

## 2017-03-28 DIAGNOSIS — E118 Type 2 diabetes mellitus with unspecified complications: Secondary | ICD-10-CM | POA: Diagnosis not present

## 2017-03-28 DIAGNOSIS — I8500 Esophageal varices without bleeding: Secondary | ICD-10-CM

## 2017-03-28 DIAGNOSIS — IMO0002 Reserved for concepts with insufficient information to code with codable children: Secondary | ICD-10-CM

## 2017-03-28 MED ORDER — INSULIN DETEMIR 100 UNIT/ML ~~LOC~~ SOLN: 50.0000 [IU] | Freq: Every day | SUBCUTANEOUS | 2 refills | Status: AC

## 2017-03-28 MED ORDER — ATORVASTATIN CALCIUM 40 MG PO TABS: 40.0000 mg | ORAL_TABLET | Freq: Every day | ORAL | 1 refills | Status: AC

## 2017-03-28 NOTE — Progress Notes (Signed)
Subjective:    Patient ID: Savannah Miles, female    DOB: 01-Mar-1956. Patient is being seen in Follow-up for management of diabetes requested by  Kennieth Rad, MD  Past Medical History:  Diagnosis Date  . Arthritis   . Borderline glaucoma   . 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 Cancer [facility])    breast cancer  . CHF (congestive heart failure) ([place])   . Depression   . Diabetes mellitus, type II ([place])   . GERD (gastroesophageal reflux disease)   . Heart murmur   . Hyperlipidemia   . Hypertension   . Kidney disease   . Neuropathy   . Osteoporosis   . PONV (postoperative nausea and vomiting)    Past Surgical History:  Procedure Laterality Date  . BREAST LUMPECTOMY Left   . COLONOSCOPY WITH PROPOFOL N/A 10/27/2015   Procedure: COLONOSCOPY WITH PROPOFOL;  Surgeon: Rogene Houston, MD;  [facility];  Service: Endoscopy;  Laterality: N/A;  100 - moved to 8:30  . DILATION AND CURETTAGE OF UTERUS    . ESOPHAGEAL BANDING N/A 08/23/2016   Procedure: ESOPHAGEAL BANDING;  Surgeon: Rogene Houston, MD;  Location: [facility];  Service: Endoscopy;  Laterality: N/A;  . ESOPHAGOGASTRODUODENOSCOPY (EGD) WITH PROPOFOL N/A 08/23/2016   Procedure: ESOPHAGOGASTRODUODENOSCOPY (EGD) WITH PROPOFOL;  Surgeon: Rogene Houston, MD;  [facility];  Service: Endoscopy;  Laterality: N/A;  11:00  . ORIF FOOT FRACTURE Left   . OTHER SURGICAL HISTORY Left Leg, Arm, Hip fx   otif, has rods and screws in each.  Marland Kitchen POLYPECTOMY  10/27/2015   Procedure: POLYPECTOMY;  Surgeon: Rogene Houston, MD;  [facility];  Service: Endoscopy;;  . SPINAL FUSION    . TRACHEOSTOMY     Social History   Socioeconomic History  . Marital status: Divorced    Spouse name: None  . Number of children: None  . Years of education: None  . Highest education level: None  Social Needs  . Financial resource strain: None  . Food insecurity - worry: None  . Food insecurity - inability: None  .  Transportation needs - medical: None  . Transportation needs - non-medical: None  Occupational History  . None  Tobacco Use  . Smoking status: Never Smoker  . Smokeless tobacco: Never Used  Substance and Sexual Activity  . Alcohol use: No  . Drug use: No  . Sexual activity: Yes    Birth control/protection: Post-menopausal  Other Topics Concern  . None  Social History Narrative  . None   Outpatient Encounter Medications as of 03/28/2017  Medication Sig  . albuterol (PROVENTIL HFA;VENTOLIN HFA) 108 (90 Base) MCG/ACT inhaler Inhale 1-2 puffs into the lungs as needed for wheezing or shortness of breath.  Marland Kitchen atorvastatin (LIPITOR) 40 MG tablet Take 1 tablet (40 mg total) by mouth daily.  Marland Kitchen denosumab (PROLIA) 60 MG/ML SOLN injection Inject 60 mg into the skin every 6 (six) months. Administer in upper arm, thigh, or abdomen  Patient states that she gets twice a year.   . gabapentin (NEURONTIN) 800 MG tablet Take 800 mg by mouth 5 (five) times daily.   . hydrALAZINE (APRESOLINE) 25 MG tablet Take 25 mg by mouth 2 (two) times daily.  Marland Kitchen HYDROcodone-acetaminophen (NORCO/VICODIN) 5-325 MG tablet Take 1 tablet by mouth every 6 (six) hours as needed (for pain).   . hydrocortisone-pramoxine (PROCTOFOAM HC) rectal foam Place 1 applicator rectally 2 (two) times  daily.  . insulin aspart (NOVOLOG) 100 UNIT/ML injection Inject 10-16 Units 3 (three) times daily with meals into the skin.  Marland Kitchen insulin detemir (LEVEMIR) 100 UNIT/ML injection Inject 0.5 mLs (50 Units total) into the skin at bedtime.  Marland Kitchen losartan (COZAAR) 100 MG tablet Take 100 mg by mouth daily.  . Melatonin-Pyridoxine (MELATIN PO) Take by mouth.  . metFORMIN (GLUCOPHAGE) 500 MG tablet TAKE 1 TABLET TWICE DAILY WITH A MEAL  . metoprolol (LOPRESSOR) 50 MG tablet Take 75 mg by mouth 2 (two) times daily.   . nitroGLYCERIN (NITROSTAT) 0.4 MG SL tablet USE 1 TAB SUBLINGUALLY EVERY 5 MINUTES FOR CHEST PAIN AS NEEDED  . omeprazole (PRILOSEC) 20 MG  capsule Take 1 capsule (20 mg total) by mouth daily before breakfast.  . potassium chloride SA (K-DUR,KLOR-CON) 20 MEQ tablet Take 1 tablet (20 mEq total) by mouth daily. (Patient taking differently: Take 20 mEq by mouth 2 (two) times daily. )  . torsemide (DEMADEX) 20 MG tablet Take 20-40 mg by mouth 2 (two) times daily. 40 mg (2 tablets) in the morning & 20 mg (1 tablet) in the afternoon with lunch  . Vitamin D, Ergocalciferol, (DRISDOL) 50000 units CAPS capsule Take 50,000 Units by mouth 2 (two) times a week. Wednesday & Saturday.  . [DISCONTINUED] atorvastatin (LIPITOR) 20 MG tablet Take 20 mg by mouth daily.  . [DISCONTINUED] insulin detemir (LEVEMIR) 100 UNIT/ML injection Inject 0.6 mLs (60 Units total) into the skin at bedtime.  . [DISCONTINUED] insulin detemir (LEVEMIR) 100 UNIT/ML injection Inject 80 Units into the skin at bedtime.  . [DISCONTINUED] Vitamin D, Ergocalciferol, (DRISDOL) 50000 units CAPS capsule Take 50,000 Units by mouth every 7 (seven) days. Twice a week   No facility-administered encounter medications on file as of 03/28/2017.    ALLERGIES: Allergies  Allergen Reactions  . Acyclovir And Related   . Codeine Other (See Comments)    dizziness   VACCINATION STATUS:  There is no immunization history on file for this patient.  Diabetes  She presents for her follow-up diabetic visit. She has type 2 diabetes mellitus. Onset time: Patient was diagnosed at approximate age of 76 years. Her disease course has been improving. There are no hypoglycemic associated symptoms. Pertinent negatives for hypoglycemia include no confusion, headaches, pallor or seizures. Associated symptoms include fatigue and polydipsia. Pertinent negatives for diabetes include no chest pain, no polyphagia, no polyuria and no weight loss. There are no hypoglycemic complications. Symptoms are improving. There are no diabetic complications. Risk factors for coronary artery disease include diabetes mellitus,  dyslipidemia, hypertension and sedentary lifestyle. Current diabetic treatment includes insulin injections and oral agent (monotherapy) (She is on toujeo 38 units in the morning and 68 units at bedtime, Humalog 28 units 3 times a day with meals. She is also on metformin 1000 mg by mouth twice a day.). Her weight is increasing steadily. She is following a generally unhealthy diet. She has not had a previous visit with a dietitian. She participates in exercise intermittently. Her breakfast blood glucose range is generally 110-130 mg/dl. Her lunch blood glucose range is generally 130-140 mg/dl. Her dinner blood glucose range is generally 130-140 mg/dl. Her overall blood glucose range is 130-140 mg/dl. (She has monitored  1-2 times a day last 30 days averaging 136. Her A1c has improved to 7.4%.  She did not bring her logs to review.) An ACE inhibitor/angiotensin II receptor blocker is being taken.  Hyperlipidemia  This is a chronic problem. The current episode started more  than 1 year ago. Pertinent negatives include no chest pain, myalgias or shortness of breath. Current antihyperlipidemic treatment includes statins. Risk factors for coronary artery disease include diabetes mellitus, dyslipidemia, hypertension, obesity and a sedentary lifestyle.  Hypertension  This is a chronic problem. The current episode started more than 1 year ago. Pertinent negatives include no chest pain, headaches, palpitations or shortness of breath. Risk factors for coronary artery disease include diabetes mellitus, dyslipidemia, obesity and sedentary lifestyle. Past treatments include angiotensin blockers.    Review of Systems  Constitutional: Positive for fatigue. Negative for chills, fever, unexpected weight change and weight loss.  HENT: Negative for trouble swallowing and voice change.   Eyes: Negative for visual disturbance.  Respiratory: Negative for cough, shortness of breath and wheezing.   Cardiovascular: Negative for  chest pain, palpitations and leg swelling.  Gastrointestinal: Negative for diarrhea, nausea and vomiting.  Endocrine: Positive for polydipsia. Negative for cold intolerance, heat intolerance, polyphagia and polyuria.  Musculoskeletal: Negative for arthralgias and myalgias.  Skin: Negative for color change, pallor, rash and wound.  Neurological: Negative for seizures and headaches.  Psychiatric/Behavioral: Negative for confusion and suicidal ideas.    Objective:    BP (!) 162/73   Pulse 63   Ht 5\' 7"  (1.702 m)   Wt 241 lb (109.3 kg)   BMI 37.75 kg/m   Wt Readings from Last 3 Encounters:  03/28/17 241 lb (109.3 kg)  03/18/17 236 lb (107 kg)  12/11/16 236 lb (107 kg)    Physical Exam  Constitutional: She is oriented to person, place, and time. She appears well-developed.  HENT:  Head: Normocephalic and atraumatic.  Eyes: EOM are normal.  Neck: Normal range of motion. Neck supple. No tracheal deviation present. No thyromegaly present.  Cardiovascular: Normal rate and regular rhythm.  Murmur heard. She has systolic murmur which radiates to the left side of the neck. She is following with cardiology had echocardiogram in the last 2 years. Records not available to review. I advised her to continue follow up closely with cardiologist.   Pulmonary/Chest: Effort normal and breath sounds normal.  Abdominal: Soft. Bowel sounds are normal. There is no tenderness. There is no guarding.  Musculoskeletal: Normal range of motion. She exhibits edema.  Neurological: She is alert and oriented to person, place, and time. She has normal reflexes. No cranial nerve deficit. Coordination normal.  Skin: Skin is warm and dry. No rash noted. No erythema. No pallor.  Psychiatric: She has a normal mood and affect. Judgment normal.   Recent Results (from the past 2160 hour(s))  [facility] DIABETES EYE EXAM     Status: None   Collection Time: 12/31/16 12:18 PM  Result Value Ref Range   [facility] Diabetic Eye Exam  No  Retinopathy  Basic metabolic panel     Status: Abnormal   Collection Time: 02/13/17 12:00 AM  Result Value Ref Range   BUN 23 (A) 4 - 21   Creatinine 1.1 0.5 - 1.1  Lipid panel     Status: Abnormal   Collection Time: 02/13/17 12:00 AM  Result Value Ref Range   Triglycerides 123 40 - 160   Cholesterol 234 (A) 0 - 200   HDL 63 35 - 70   LDL Cholesterol 146   Hemoglobin A1c     Status: None   Collection Time: 02/13/17 12:00 AM  Result Value Ref Range   Hemoglobin A1C 6.7   CBC with Differential/Platelet     Status: Abnormal   Collection Time:  03/18/17  2:51 PM  Result Value Ref Range   WBC 5.3 3.8 - 10.8 Thousand/uL   RBC 3.38 (L) 3.80 - 5.10 Million/uL   Hemoglobin 10.8 (L) 11.7 - 15.5 g/dL   HCT 32.7 (L) 35.0 - 45.0 %   MCV 96.7 80.0 - 100.0 fL   MCH 32.0 27.0 - 33.0 pg   MCHC 33.0 32.0 - 36.0 g/dL   RDW 12.9 11.0 - 15.0 %   Platelets 87 (L) 140 - 400 Thousand/uL   MPV 10.9 7.5 - 12.5 fL   Neutro Abs 3,821 1,500 - 7,800 cells/uL   Lymphs Abs 1,007 850 - 3,900 cells/uL   WBC mixed population 371 200 - 950 cells/uL   Eosinophils Absolute 80 15 - 500 cells/uL   Basophils Absolute 21 0 - 200 cells/uL   Neutrophils Relative % 72.1 %   Total Lymphocyte 19.0 %   Monocytes Relative 7.0 %   Eosinophils Relative 1.5 %   Basophils Relative 0.4 %   Smear Review      Comment: No platelet clumps seen. Review of peripheral smear confirms automated results.   Hepatic function panel     Status: Abnormal   Collection Time: 03/18/17  2:51 PM  Result Value Ref Range   Total Protein 5.7 (L) 6.1 - 8.1 g/dL   Albumin 3.0 (L) 3.6 - 5.1 g/dL   Globulin 2.7 1.9 - 3.7 g/dL (calc)   AG Ratio 1.1 1.0 - 2.5 (calc)   Total Bilirubin 0.7 0.2 - 1.2 mg/dL   Bilirubin, Direct 0.2 0.0 - 0.2 mg/dL   Indirect Bilirubin 0.5 0.2 - 1.2 mg/dL (calc)   Alkaline phosphatase (APISO) 127 33 - 130 U/L   AST 46 (H) 10 - 35 U/L   ALT 35 (H) 6 - 29 U/L      Diabetic Labs (most recent): Lab Results   Component Value Date   HGBA1C 6.7 02/13/2017   HGBA1C 7.4 10/14/2016   HGBA1C 9.8 07/03/2016    Assessment & Plan:   1. Uncontrolled type 2 diabetes mellitus with complication, with long-term current use of insulin ([place])  - Patient has currently uncontrolled symptomatic type 2 DM since  61 years of age. - She continued to improve in her glycemic profile. Her A1c is 6.7%, progressively improving from 12.3%. - Average blood glucose in the last 30 days is 154.  - Recent labs reviewed.   Her diabetes is complicated by obesity and patient remains at a high risk for more acute and chronic complications of diabetes which include CAD, CVA, CKD, retinopathy, and neuropathy. These are all discussed in detail with the patient.  - I have counseled the patient on diet management and weight loss, by adopting a carbohydrate restricted/protein rich diet.  -  Suggestion is made for her to avoid simple carbohydrates  from her diet including Cakes, Sweet Desserts / Pastries, Ice Cream, Soda (diet and regular), Sweet Tea, Candies, Chips, Cookies, Store Bought Juices, Alcohol in Excess of  1-2 drinks a day, Artificial Sweeteners, and "Sugar-free" Products. This will help patient to have stable blood glucose profile and potentially avoid unintended weight gain.  - I encouraged the patient to switch to  unprocessed or minimally processed complex starch and increased protein intake (animal or plant source), fruits, and vegetables.  - Patient is advised to stick to a routine mealtimes to eat 3 meals  a day and avoid unnecessary snacks ( to snack only to correct hypoglycemia).   - I have approached patient  with the following individualized plan to manage diabetes and patient agrees:    I  urged her to continue  basal/ bolus insulin.   - I advised her to decrease Levemir to 50 units daily at bedtime, continue  NovoLog  10 units   for pre-meal BG readings of 90-150mg /dl, plus patient specific correction dose for  unexpected hyperglycemia above 150mg /dl, associated with strict monitoring of glucose   4 times a day-before meals and at bedtime.  - She is awaiting the prescription decision on  continuous glucose monitoring.  - Patient is warned not to take insulin without proper monitoring per orders. -Adjustment parameters are given for hypo and hyperglycemia in writing. -Patient is encouraged to call clinic for blood glucose levels less than 70 or above 300 mg /dl. - I will continue metformin to 500 mg by mouth twice a day, therapeutically suitable for patient.  - Patient specific target  A1c;  LDL, HDL, Triglycerides, and  Waist Circumference were discussed in detail.  2) BP/HTN: uncontrolled. Continue current medications including ACEI/ARB. 3) Lipids/HPL: uncontrolled LDL RISING TO  146 from 120 . I advised her token continue and increase atorvastatin to 40 mg by mouth daily at bedtime.   4)  Weight/Diet:   patient is obese, CDE Consult has been initiated , exercise, and detailed carbohydrates information provided.  5) Chronic Care/Health Maintenance:  -Patient is on ACEI/ARB and Statin medications and encouraged to continue to follow up with Ophthalmology, Podiatrist at least yearly or according to recommendations, and advised to   stay away from smoking. I have recommended yearly flu vaccine and pneumonia vaccination at least every 5 years; moderate intensity exercise for up to 150 minutes weekly; and  sleep for at least 7 hours a day. - She is advised to continue follow-up with a cardiology regarding her upcoming echocardiogram.  - I advised patient to maintain close follow up with Kennieth Rad, MD for primary care needs.  Follow up plan: - Return in about 3 months (around 06/26/2017) for follow up with pre-visit labs, meter, and logs.  Glade Lloyd, MD Phone: [phone]  Fax: [phone]  This note was partially dictated with voice recognition software. Similar sounding words can be transcribed  inadequately or may not  be corrected upon review.  03/28/2017, 9:30 AM

## 2017-03-28 NOTE — Patient Instructions (Signed)

## 2017-04-02 ENCOUNTER — Encounter (INDEPENDENT_AMBULATORY_CARE_PROVIDER_SITE_OTHER): Payer: Self-pay | Admitting: *Deleted

## 2017-04-02 ENCOUNTER — Other Ambulatory Visit (INDEPENDENT_AMBULATORY_CARE_PROVIDER_SITE_OTHER): Payer: Self-pay | Admitting: *Deleted

## 2017-04-02 DIAGNOSIS — K649 Unspecified hemorrhoids: Secondary | ICD-10-CM

## 2017-04-03 NOTE — Patient Instructions (Signed)
Savannah Miles  04/03/2017     @PREFPERIOPPHARMACY @   Your procedure is scheduled on  04/11/2017 .  Report to [facility] at  1125   A.M.  Call this number if you have problems the morning of surgery:  [phone]   Remember:  Do not eat food or drink liquids after midnight.  Take these medicines the morning of surgery with A SIP OF WATER  Neurontin, hydrocodone, losartan, metoprolol, prilosec. Take 25 units of bedtime Insulin the night before your procedure. If your glucose the morning of your procedure is over 220, take 1/2 of your usual correction. Use your inhaler before you come.   Do not wear jewelry, make-up or nail polish.  Do not wear lotions, powders, or perfumes, or deodorant.  Do not shave 48 hours prior to surgery.  Men may shave face and neck.  Do not bring valuables to the hospital.  [facility] is not responsible for any belongings or valuables.  Contacts, dentures or bridgework may not be worn into surgery.  Leave your suitcase in the car.  After surgery it may be brought to your room.  For patients admitted to the hospital, discharge time will be determined by your treatment team.  Patients discharged the day of surgery will not be allowed to drive home.   Name and phone number of your driver:   family Special instructions:  Follow the diet and prep instructions given to you by Dr Olevia Perches office.  Please read over the following fact sheets that you were given. Anesthesia Post-op Instructions and Care and Recovery After Surgery       Esophagogastroduodenoscopy Esophagogastroduodenoscopy (EGD) is a procedure to examine the lining of the esophagus, stomach, and first part of the small intestine (duodenum). This procedure is done to check for problems such as inflammation, bleeding, ulcers, or growths. During this procedure, a long, flexible, lighted tube with a camera attached (endoscope) is inserted down the throat. Tell a health care  provider about:  Any allergies you have.  All medicines you are taking, including vitamins, herbs, eye drops, creams, and over-the-counter medicines.  Any problems you or family members have had with anesthetic medicines.  Any blood disorders you have.  Any surgeries you have had.  Any medical conditions you have.  Whether you are pregnant or may be pregnant. What are the risks? Generally, this is a safe procedure. However, problems may occur, including:  Infection.  Bleeding.  A tear (perforation) in the esophagus, stomach, or duodenum.  Trouble breathing.  Excessive sweating.  Spasms of the larynx.  A slowed heartbeat.  Low blood pressure.  What happens before the procedure?  Follow instructions from your health care provider about eating or drinking restrictions.  Ask your health care provider about: ? Changing or stopping your regular medicines. This is especially important if you are taking diabetes medicines or blood thinners. ? Taking medicines such as aspirin and ibuprofen. These medicines can thin your blood. Do not take these medicines before your procedure if your health care provider instructs you not to.  Plan to have someone take you home after the procedure.  If you wear dentures, be ready to remove them before the procedure. What happens during the procedure?  To reduce your risk of infection, your health care team will wash or sanitize their hands.  An IV tube will be put in a vein in your hand or arm. You will  get medicines and fluids through this tube.  You will be given one or more of the following: ? A medicine to help you relax (sedative). ? A medicine to numb the area (local anesthetic). This medicine may be sprayed into your throat. It will make you feel more comfortable and keep you from gagging or coughing during the procedure. ? A medicine for pain.  A mouth guard may be placed in your mouth to protect your teeth and to keep you from  biting on the endoscope.  You will be asked to lie on your left side.  The endoscope will be lowered down your throat into your esophagus, stomach, and duodenum.  Air will be put into the endoscope. This will help your health care provider see better.  The lining of your esophagus, stomach, and duodenum will be examined.  Your health care provider may: ? Take a tissue sample so it can be looked at in a lab (biopsy). ? Remove growths. ? Remove objects (foreign bodies) that are stuck. ? Treat any bleeding with medicines or other devices that stop tissue from bleeding. ? Widen (dilate) or stretch narrowed areas of your esophagus and stomach.  The endoscope will be taken out. The procedure may vary among health care providers and hospitals. What happens after the procedure?  Your blood pressure, heart rate, breathing rate, and blood oxygen level will be monitored often until the medicines you were given have worn off.  Do not eat or drink anything until the numbing medicine has worn off and your gag reflex has returned. This information is not intended to replace advice given to you by your health care provider. Make sure you discuss any questions you have with your health care provider. Document Released: 07/19/2004 Document Revised: 08/24/2015 Document Reviewed: 02/09/2015 Elsevier Interactive Patient Education  2018 [employer]. Esophagogastroduodenoscopy, Care After Refer to this sheet in the next few weeks. These instructions provide you with information about caring for yourself after your procedure. Your health care provider may also give you more specific instructions. Your treatment has been planned according to current medical practices, but problems sometimes occur. Call your health care provider if you have any problems or questions after your procedure. What can I expect after the procedure? After the procedure, it is common to have:  A sore  throat.  Nausea.  Bloating.  Dizziness.  Fatigue.  Follow these instructions at home:  Do not eat or drink anything until the numbing medicine (local anesthetic) has worn off and your gag reflex has returned. You will know that the local anesthetic has worn off when you can swallow comfortably.  Do not drive for 24 hours if you received a medicine to help you relax (sedative).  If your health care provider took a tissue sample for testing during the procedure, make sure to get your test results. This is your responsibility. Ask your health care provider or the department performing the test when your results will be ready.  Keep all follow-up visits as told by your health care provider. This is important. Contact a health care provider if:  You cannot stop coughing.  You are not urinating.  You are urinating less than usual. Get help right away if:  You have trouble swallowing.  You cannot eat or drink.  You have throat or chest pain that gets worse.  You are dizzy or light-headed.  You faint.  You have nausea or vomiting.  You have chills.  You have a fever.  You have severe abdominal pain.  You have black, tarry, or bloody stools. This information is not intended to replace advice given to you by your health care provider. Make sure you discuss any questions you have with your health care provider. Document Released: 03/04/2012 Document Revised: 08/24/2015 Document Reviewed: 02/09/2015 Elsevier Interactive Patient Education  2018 [employer].  Esophageal Dilatation Esophageal dilatation is a procedure to open a blocked or narrowed part of the esophagus. The esophagus is the long tube in your throat that carries food and liquid from your mouth to your stomach. The procedure is also called esophageal dilation. You may need this procedure if you have a buildup of scar tissue in your esophagus that makes it difficult, painful, or even impossible to swallow. This  can be caused by gastroesophageal reflux disease (GERD). In rare cases, people need this procedure because they have cancer of the esophagus or a problem with the way food moves through the esophagus. Sometimes you may need to have another dilatation to enlarge the opening of the esophagus gradually. Tell a health care provider about:  Any allergies you have.  All medicines you are taking, including vitamins, herbs, eye drops, creams, and over-the-counter medicines.  Any problems you or family members have had with anesthetic medicines.  Any blood disorders you have.  Any surgeries you have had.  Any medical conditions you have.  Any antibiotic medicines you are required to take before dental procedures. What are the risks? Generally, this is a safe procedure. However, problems can occur and include:  Bleeding from a tear in the lining of the esophagus.  A hole (perforation) in the esophagus.  What happens before the procedure?  Do not eat or drink anything after midnight on the night before the procedure or as directed by your health care provider.  Ask your health care provider about changing or stopping your regular medicines. This is especially important if you are taking diabetes medicines or blood thinners.  Plan to have someone take you home after the procedure. What happens during the procedure?  You will be given a medicine that makes you relaxed and sleepy (sedative).  A medicine may be sprayed or gargled to numb the back of the throat.  Your health care provider can use various instruments to do an esophageal dilatation. During the procedure, the instrument used will be placed in your mouth and passed down into your esophagus. Options include: ? Simple dilators. This instrument is carefully placed in the esophagus to stretch it. ? Guided wire bougies. In this method, a flexible tube (endoscope) is used to insert a wire into the esophagus. The dilator is passed over  this wire to enlarge the esophagus. Then the wire is removed. ? Balloon dilators. An endoscope with a small balloon at the end is passed down into the esophagus. Inflating the balloon gently stretches the esophagus and opens it up. What happens after the procedure?  Your blood pressure, heart rate, breathing rate, and blood oxygen level will be monitored often until the medicines you were given have worn off.  Your throat may feel slightly sore and will probably still feel numb. This will improve slowly over time.  You will not be allowed to eat or drink until the throat numbness has resolved.  If this is a same-day procedure, you may be allowed to go home once you have been able to drink, urinate, and sit on the edge of the bed without nausea or dizziness.  If this is  a same-day procedure, you should have a friend or family member with you for the next 24 hours after the procedure. This information is not intended to replace advice given to you by your health care provider. Make sure you discuss any questions you have with your health care provider. Document Released: 05/09/2005 Document Revised: 08/24/2015 Document Reviewed: 07/28/2013 Elsevier Interactive Patient Education  2018 [place] Anesthesia is a term that refers to techniques, procedures, and medicines that help a person stay safe and comfortable during a medical procedure. Monitored anesthesia care, or sedation, is one type of anesthesia. Your anesthesia specialist may recommend sedation if you will be having a procedure that does not require you to be unconscious, such as:  Cataract surgery.  A dental procedure.  A biopsy.  A colonoscopy.  During the procedure, you may receive a medicine to help you relax (sedative). There are three levels of sedation:  Mild sedation. At this level, you may feel awake and relaxed. You will be able to follow directions.  Moderate sedation. At this level,  you will be sleepy. You may not remember the procedure.  Deep sedation. At this level, you will be asleep. You will not remember the procedure.  The more medicine you are given, the deeper your level of sedation will be. Depending on how you respond to the procedure, the anesthesia specialist may change your level of sedation or the type of anesthesia to fit your needs. An anesthesia specialist will monitor you closely during the procedure. Let your health care provider know about:  Any allergies you have.  All medicines you are taking, including vitamins, herbs, eye drops, creams, and over-the-counter medicines.  Any use of steroids (by mouth or as a cream).  Any problems you or family members have had with sedatives and anesthetic medicines.  Any blood disorders you have.  Any surgeries you have had.  Any medical conditions you have, such as sleep apnea.  Whether you are pregnant or may be pregnant.  Any use of cigarettes, alcohol, or street drugs. What are the risks? Generally, this is a safe procedure. However, problems may occur, including:  Getting too much medicine (oversedation).  Nausea.  Allergic reaction to medicines.  Trouble breathing. If this happens, a breathing tube may be used to help with breathing. It will be removed when you are awake and breathing on your own.  Heart trouble.  Lung trouble.  Before the procedure Staying hydrated Follow instructions from your health care provider about hydration, which may include:  Up to 2 hours before the procedure - you may continue to drink clear liquids, such as water, clear fruit juice, black coffee, and plain tea.  Eating and drinking restrictions Follow instructions from your health care provider about eating and drinking, which may include:  8 hours before the procedure - stop eating heavy meals or foods such as meat, fried foods, or fatty foods.  6 hours before the procedure - stop eating light meals or  foods, such as toast or cereal.  6 hours before the procedure - stop drinking milk or drinks that contain milk.  2 hours before the procedure - stop drinking clear liquids.  Medicines Ask your health care provider about:  Changing or stopping your regular medicines. This is especially important if you are taking diabetes medicines or blood thinners.  Taking medicines such as aspirin and ibuprofen. These medicines can thin your blood. Do not take these medicines before your procedure if your  health care provider instructs you not to.  Tests and exams  You will have a physical exam.  You may have blood tests done to show: ? How well your kidneys and liver are working. ? How well your blood can clot.  General instructions  Plan to have someone take you home from the hospital or clinic.  If you will be going home right after the procedure, plan to have someone with you for 24 hours.  What happens during the procedure?  Your blood pressure, heart rate, breathing, level of pain and overall condition will be monitored.  An IV tube will be inserted into one of your veins.  Your anesthesia specialist will give you medicines as needed to keep you comfortable during the procedure. This may mean changing the level of sedation.  The procedure will be performed. After the procedure  Your blood pressure, heart rate, breathing rate, and blood oxygen level will be monitored until the medicines you were given have worn off.  Do not drive for 24 hours if you received a sedative.  You may: ? Feel sleepy, clumsy, or nauseous. ? Feel forgetful about what happened after the procedure. ? Have a sore throat if you had a breathing tube during the procedure. ? Vomit. This information is not intended to replace advice given to you by your health care provider. Make sure you discuss any questions you have with your health care provider. Document Released: 12/12/2004 Document Revised: 08/25/2015  Document Reviewed: 07/09/2015 Elsevier Interactive Patient Education  2018 [place], Care After These instructions provide you with information about caring for yourself after your procedure. Your health care provider may also give you more specific instructions. Your treatment has been planned according to current medical practices, but problems sometimes occur. Call your health care provider if you have any problems or questions after your procedure. What can I expect after the procedure? After your procedure, it is common to:  Feel sleepy for several hours.  Feel clumsy and have poor balance for several hours.  Feel forgetful about what happened after the procedure.  Have poor judgment for several hours.  Feel nauseous or vomit.  Have a sore throat if you had a breathing tube during the procedure.  Follow these instructions at home: For at least 24 hours after the procedure:   Do not: ? Participate in activities in which you could fall or become injured. ? Drive. ? Use heavy machinery. ? Drink alcohol. ? Take sleeping pills or medicines that cause drowsiness. ? Make important decisions or sign legal documents. ? Take care of children on your own.  Rest. Eating and drinking  Follow the diet that is recommended by your health care provider.  If you vomit, drink water, juice, or soup when you can drink without vomiting.  Make sure you have little or no nausea before eating solid foods. General instructions  Have a responsible adult stay with you until you are awake and alert.  Take over-the-counter and prescription medicines only as told by your health care provider.  If you smoke, do not smoke without supervision.  Keep all follow-up visits as told by your health care provider. This is important. Contact a health care provider if:  You keep feeling nauseous or you keep vomiting.  You feel light-headed.  You develop a rash.  You  have a fever. Get help right away if:  You have trouble breathing. This information is not intended to replace advice given to  you by your health care provider. Make sure you discuss any questions you have with your health care provider. Document Released: 07/09/2015 Document Revised: 11/08/2015 Document Reviewed: 07/09/2015 Elsevier Interactive Patient Education  [employer].

## 2017-04-09 ENCOUNTER — Other Ambulatory Visit (HOSPITAL_COMMUNITY): Payer: Medicare PPO

## 2017-04-09 ENCOUNTER — Telehealth (INDEPENDENT_AMBULATORY_CARE_PROVIDER_SITE_OTHER): Payer: Self-pay | Admitting: *Deleted

## 2017-04-09 ENCOUNTER — Encounter (HOSPITAL_COMMUNITY)
Admission: RE | Admit: 2017-04-09 | Discharge: 2017-04-09 | Disposition: A | Discharge status: Home / Self Care | Payer: Medicare PPO | Source: Ambulatory Visit | Attending: Internal Medicine | Admitting: Internal Medicine

## 2017-04-09 NOTE — Telephone Encounter (Signed)
Patient states someone called her before 1st of year about owing 44.00 from May 2018, and they were gonna call her back -- told patient we didn't do billing here but I would let you know so you could check this out for her

## 2017-04-10 ENCOUNTER — Encounter (HOSPITAL_COMMUNITY)
Admission: RE | Admit: 2017-04-10 | Discharge: 2017-04-10 | Disposition: A | Discharge status: Home / Self Care | Payer: Medicare PPO | Source: Ambulatory Visit | Attending: Internal Medicine | Admitting: Internal Medicine

## 2017-04-10 ENCOUNTER — Other Ambulatory Visit: Payer: Self-pay

## 2017-04-10 ENCOUNTER — Encounter (HOSPITAL_COMMUNITY): Payer: Self-pay

## 2017-04-10 DIAGNOSIS — R011 Cardiac murmur, unspecified: Secondary | ICD-10-CM | POA: Diagnosis not present

## 2017-04-10 DIAGNOSIS — I509 Heart failure, unspecified: Secondary | ICD-10-CM | POA: Diagnosis not present

## 2017-04-10 DIAGNOSIS — K228 Other specified diseases of esophagus: Secondary | ICD-10-CM | POA: Diagnosis not present

## 2017-04-10 DIAGNOSIS — M810 Age-related osteoporosis without current pathological fracture: Secondary | ICD-10-CM | POA: Diagnosis not present

## 2017-04-10 DIAGNOSIS — Z888 Allergy status to other drugs, medicaments and biological substances status: Secondary | ICD-10-CM | POA: Diagnosis not present

## 2017-04-10 DIAGNOSIS — K7460 Unspecified cirrhosis of liver: Secondary | ICD-10-CM

## 2017-04-10 DIAGNOSIS — I8500 Esophageal varices without bleeding: Secondary | ICD-10-CM | POA: Diagnosis present

## 2017-04-10 DIAGNOSIS — K7469 Other cirrhosis of liver: Secondary | ICD-10-CM

## 2017-04-10 DIAGNOSIS — Z01818 Encounter for other preprocedural examination: Secondary | ICD-10-CM | POA: Diagnosis not present

## 2017-04-10 DIAGNOSIS — K219 Gastro-esophageal reflux disease without esophagitis: Secondary | ICD-10-CM | POA: Diagnosis not present

## 2017-04-10 DIAGNOSIS — I110 Hypertensive heart disease with heart failure: Secondary | ICD-10-CM | POA: Diagnosis not present

## 2017-04-10 DIAGNOSIS — Z853 Personal history of malignant neoplasm of breast: Secondary | ICD-10-CM | POA: Diagnosis not present

## 2017-04-10 DIAGNOSIS — K7581 Nonalcoholic steatohepatitis (NASH): Secondary | ICD-10-CM | POA: Diagnosis not present

## 2017-04-10 DIAGNOSIS — I8510 Secondary esophageal varices without bleeding: Secondary | ICD-10-CM

## 2017-04-10 DIAGNOSIS — E1140 Type 2 diabetes mellitus with diabetic neuropathy, unspecified: Secondary | ICD-10-CM | POA: Diagnosis not present

## 2017-04-10 DIAGNOSIS — Z79899 Other long term (current) drug therapy: Secondary | ICD-10-CM | POA: Diagnosis not present

## 2017-04-10 DIAGNOSIS — Z885 Allergy status to narcotic agent status: Secondary | ICD-10-CM | POA: Diagnosis not present

## 2017-04-10 DIAGNOSIS — Z794 Long term (current) use of insulin: Secondary | ICD-10-CM | POA: Diagnosis not present

## 2017-04-10 DIAGNOSIS — K3189 Other diseases of stomach and duodenum: Secondary | ICD-10-CM | POA: Diagnosis not present

## 2017-04-10 DIAGNOSIS — K766 Portal hypertension: Secondary | ICD-10-CM | POA: Diagnosis not present

## 2017-04-10 DIAGNOSIS — F329 Major depressive disorder, single episode, unspecified: Secondary | ICD-10-CM | POA: Diagnosis not present

## 2017-04-10 DIAGNOSIS — M1990 Unspecified osteoarthritis, unspecified site: Secondary | ICD-10-CM | POA: Diagnosis not present

## 2017-04-10 DIAGNOSIS — E785 Hyperlipidemia, unspecified: Secondary | ICD-10-CM | POA: Diagnosis not present

## 2017-04-10 HISTORY — DX: Dependence on supplemental oxygen: Z99.81

## 2017-04-10 LAB — CBC WITH DIFFERENTIAL/PLATELET
BASOS ABS: 0 10*3/uL (ref 0.0–0.1)
BASOS PCT: 1 %
EOS ABS: 0.1 10*3/uL (ref 0.0–0.7)
EOS PCT: 2 %
HCT: 35.1 % — ABNORMAL LOW (ref 36.0–46.0)
Hemoglobin: 11.1 g/dL — ABNORMAL LOW (ref 12.0–15.0)
Lymphocytes Relative: 20 %
Lymphs Abs: 1 10*3/uL (ref 0.7–4.0)
MCH: 31.5 pg (ref 26.0–34.0)
MCHC: 31.6 g/dL (ref 30.0–36.0)
MCV: 99.7 fL (ref 78.0–100.0)
Monocytes Absolute: 0.4 10*3/uL (ref 0.1–1.0)
Monocytes Relative: 8 %
NEUTROS PCT: 69 %
Neutro Abs: 3.3 10*3/uL (ref 1.7–7.7)
PLATELETS: 113 10*3/uL — AB (ref 150–400)
RBC: 3.52 MIL/uL — AB (ref 3.87–5.11)
RDW: 13.5 % (ref 11.5–15.5)
WBC: 4.8 10*3/uL (ref 4.0–10.5)

## 2017-04-10 LAB — BASIC METABOLIC PANEL
Anion gap: 10 (ref 5–15)
BUN: 26 mg/dL — AB (ref 6–20)
CALCIUM: 8.5 mg/dL — AB (ref 8.9–10.3)
CO2: 24 mmol/L (ref 22–32)
CREATININE: 0.98 mg/dL (ref 0.44–1.00)
Chloride: 107 mmol/L (ref 101–111)
Glucose, Bld: 126 mg/dL — ABNORMAL HIGH (ref 65–99)
Potassium: 3.8 mmol/L (ref 3.5–5.1)
SODIUM: 141 mmol/L (ref 135–145)

## 2017-04-11 ENCOUNTER — Encounter (HOSPITAL_COMMUNITY): Payer: Self-pay | Admitting: *Deleted

## 2017-04-11 ENCOUNTER — Encounter (HOSPITAL_COMMUNITY)
Admission: RE | Disposition: A | Discharge status: Home / Self Care | Payer: Self-pay | Source: Ambulatory Visit | Attending: Internal Medicine

## 2017-04-11 ENCOUNTER — Ambulatory Visit (HOSPITAL_COMMUNITY): Payer: Medicare PPO | Admitting: Anesthesiology

## 2017-04-11 ENCOUNTER — Ambulatory Visit (HOSPITAL_COMMUNITY)
Admission: RE | Admit: 2017-04-11 | Discharge: 2017-04-11 | Disposition: A | Discharge status: Home / Self Care | Payer: Medicare PPO | Source: Ambulatory Visit | Attending: Internal Medicine | Admitting: Internal Medicine

## 2017-04-11 DIAGNOSIS — K3189 Other diseases of stomach and duodenum: Secondary | ICD-10-CM | POA: Insufficient documentation

## 2017-04-11 DIAGNOSIS — I509 Heart failure, unspecified: Secondary | ICD-10-CM | POA: Insufficient documentation

## 2017-04-11 DIAGNOSIS — Z794 Long term (current) use of insulin: Secondary | ICD-10-CM | POA: Insufficient documentation

## 2017-04-11 DIAGNOSIS — I8510 Secondary esophageal varices without bleeding: Secondary | ICD-10-CM | POA: Insufficient documentation

## 2017-04-11 DIAGNOSIS — E785 Hyperlipidemia, unspecified: Secondary | ICD-10-CM | POA: Insufficient documentation

## 2017-04-11 DIAGNOSIS — Z885 Allergy status to narcotic agent status: Secondary | ICD-10-CM | POA: Insufficient documentation

## 2017-04-11 DIAGNOSIS — K219 Gastro-esophageal reflux disease without esophagitis: Secondary | ICD-10-CM | POA: Insufficient documentation

## 2017-04-11 DIAGNOSIS — K766 Portal hypertension: Secondary | ICD-10-CM | POA: Insufficient documentation

## 2017-04-11 DIAGNOSIS — Z01818 Encounter for other preprocedural examination: Secondary | ICD-10-CM | POA: Insufficient documentation

## 2017-04-11 DIAGNOSIS — E1140 Type 2 diabetes mellitus with diabetic neuropathy, unspecified: Secondary | ICD-10-CM | POA: Insufficient documentation

## 2017-04-11 DIAGNOSIS — K228 Other specified diseases of esophagus: Secondary | ICD-10-CM | POA: Diagnosis not present

## 2017-04-11 DIAGNOSIS — M1990 Unspecified osteoarthritis, unspecified site: Secondary | ICD-10-CM | POA: Insufficient documentation

## 2017-04-11 DIAGNOSIS — F329 Major depressive disorder, single episode, unspecified: Secondary | ICD-10-CM | POA: Insufficient documentation

## 2017-04-11 DIAGNOSIS — K7460 Unspecified cirrhosis of liver: Secondary | ICD-10-CM | POA: Insufficient documentation

## 2017-04-11 DIAGNOSIS — K7469 Other cirrhosis of liver: Secondary | ICD-10-CM | POA: Insufficient documentation

## 2017-04-11 DIAGNOSIS — Z853 Personal history of malignant neoplasm of breast: Secondary | ICD-10-CM | POA: Insufficient documentation

## 2017-04-11 DIAGNOSIS — R011 Cardiac murmur, unspecified: Secondary | ICD-10-CM | POA: Insufficient documentation

## 2017-04-11 DIAGNOSIS — M810 Age-related osteoporosis without current pathological fracture: Secondary | ICD-10-CM | POA: Insufficient documentation

## 2017-04-11 DIAGNOSIS — Z888 Allergy status to other drugs, medicaments and biological substances status: Secondary | ICD-10-CM | POA: Insufficient documentation

## 2017-04-11 DIAGNOSIS — I110 Hypertensive heart disease with heart failure: Secondary | ICD-10-CM | POA: Insufficient documentation

## 2017-04-11 DIAGNOSIS — Z79899 Other long term (current) drug therapy: Secondary | ICD-10-CM | POA: Insufficient documentation

## 2017-04-11 DIAGNOSIS — I8500 Esophageal varices without bleeding: Secondary | ICD-10-CM | POA: Diagnosis not present

## 2017-04-11 DIAGNOSIS — K7581 Nonalcoholic steatohepatitis (NASH): Secondary | ICD-10-CM | POA: Insufficient documentation

## 2017-04-11 HISTORY — PX: ESOPHAGOGASTRODUODENOSCOPY (EGD) WITH PROPOFOL: SHX5813

## 2017-04-11 LAB — GLUCOSE, CAPILLARY
GLUCOSE-CAPILLARY: 112 mg/dL — AB (ref 65–99)
GLUCOSE-CAPILLARY: 107 mg/dL — AB (ref 65–99)
Glucose-Capillary: 117 mg/dL — ABNORMAL HIGH (ref 65–99)

## 2017-04-11 SURGERY — ESOPHAGOGASTRODUODENOSCOPY (EGD) WITH PROPOFOL
Anesthesia: Monitor Anesthesia Care

## 2017-04-11 MED ORDER — CHLORHEXIDINE GLUCONATE CLOTH 2 % EX PADS: 6.0000 | MEDICATED_PAD | Freq: Once | CUTANEOUS | Status: DC

## 2017-04-11 MED ORDER — PROPOFOL 10 MG/ML IV BOLUS
INTRAVENOUS | Status: AC
Start: 2017-04-11 — End: 2017-04-11
  Filled 2017-04-11: qty 20

## 2017-04-11 MED ORDER — FENTANYL CITRATE (PF) 100 MCG/2ML IJ SOLN
25.0000 ug | Freq: Once | INTRAMUSCULAR | Status: AC
  Administered 2017-04-11: 25 ug via INTRAVENOUS

## 2017-04-11 MED ORDER — PROPOFOL 500 MG/50ML IV EMUL
INTRAVENOUS | Status: DC | PRN
  Administered 2017-04-11: 125 ug/kg/min via INTRAVENOUS

## 2017-04-11 MED ORDER — LIDOCAINE VISCOUS 2 % MT SOLN
5.0000 mL | Freq: Once | OROMUCOSAL | Status: AC
  Administered 2017-04-11: 5 mL via OROMUCOSAL

## 2017-04-11 MED ORDER — FENTANYL CITRATE (PF) 100 MCG/2ML IJ SOLN
INTRAMUSCULAR | Status: AC
  Filled 2017-04-11: qty 2

## 2017-04-11 MED ORDER — MIDAZOLAM HCL 2 MG/2ML IJ SOLN
INTRAMUSCULAR | Status: AC
  Filled 2017-04-11: qty 2

## 2017-04-11 MED ORDER — LIDOCAINE VISCOUS 2 % MT SOLN
OROMUCOSAL | Status: AC
  Filled 2017-04-11: qty 15

## 2017-04-11 MED ORDER — MIDAZOLAM HCL 5 MG/5ML IJ SOLN
INTRAMUSCULAR | Status: DC | PRN
  Administered 2017-04-11: 2 mg via INTRAVENOUS

## 2017-04-11 MED ORDER — LACTATED RINGERS IV SOLN
INTRAVENOUS | Status: DC
  Administered 2017-04-11: 11:00:00 via INTRAVENOUS

## 2017-04-11 MED ORDER — MIDAZOLAM HCL 2 MG/2ML IJ SOLN
1.0000 mg | INTRAMUSCULAR | Status: AC
  Administered 2017-04-11: 2 mg via INTRAVENOUS

## 2017-04-11 NOTE — Op Note (Addendum)
[facility] Patient Name: Savannah Miles Procedure Date: 04/11/2017 11:28 AM MRN: [id number] Date of Birth: [birth year] Attending MD: Hildred Laser , MD CSN: [id number] Age: 62 Admit Type: Outpatient Procedure:                Upper GI endoscopy Indications:              Follow-up of esophageal varices, For therapy of                            esophageal varices Providers:                Hildred Laser, MD, Hinton Rao, RN Referring MD:             Kennieth Rad, MD Medicines:                Lidocaine spray, Propofol per Anesthesia Complications:            No immediate complications. Estimated Blood Loss:     Estimated blood loss: none. Procedure:                Pre-Anesthesia Assessment:                           - Prior to the procedure, a History and Physical                            was performed, and patient medications and                            allergies were reviewed. The patient's tolerance of                            previous anesthesia was also reviewed. The risks                            and benefits of the procedure and the sedation                            options and risks were discussed with the patient.                            All questions were answered, and informed consent                            was obtained. Prior Anticoagulants: The patient has                            taken no previous anticoagulant or antiplatelet                            agents. ASA Grade Assessment: III - A patient with                            severe systemic disease. After reviewing the risks  and benefits, the patient was deemed in                            satisfactory condition to undergo the procedure.                           After obtaining informed consent, the endoscope was                            passed under direct vision. Throughout the                            procedure, the patient's blood pressure, pulse, and                         oxygen saturations were monitored continuously. The                            [id number] ([id number]) scope was introduced through the                            mouth, and advanced to the second part of duodenum.                            The upper GI endoscopy was accomplished without                            difficulty. The patient tolerated the procedure                            well. Scope In: 12:13:15 PM Scope Out: 12:19:23 PM Total Procedure Duration: 0 hours 6 minutes 8 seconds  Findings:      The proximal esophagus and mid esophagus were normal.      A post variceal banding scar was found in the distal esophagus.      Grade I, grade II varices were found in the distal esophagus.      The Z-line was regular and was found 40 cm from the incisors.      Moderate portal hypertensive gastropathy was found in the gastric       fundus, in the gastric body and in the gastric antrum.      The exam of the stomach was otherwise normal.      The duodenal bulb and second portion of the duodenum were normal. Impression:               - Normal proximal esophagus and mid esophagus.                           - Scar in the distal esophagus.                           - Grade I and grade II esophageal varices.                           - Z-line regular, 40 cm from the incisors.                           -  Portal hypertensive gastropathy.                           - Normal duodenal bulb and second portion of the                            duodenum.                           - No specimens collected. Moderate Sedation:      Per Anesthesia Care Recommendation:           - Patient has a contact number available for                            emergencies. The signs and symptoms of potential                            delayed complications were discussed with the                            patient. Return to normal activities tomorrow.                            Written discharge  instructions were provided to the                            patient.                           - Resume previous diet today.                           - Continue present medications.                           - Repeat upper endoscopy in 1 year.                           - Return to GI clinic in 6 months. Procedure Code(s):        --- Professional ---                           [phone], Esophagogastroduodenoscopy, flexible,                            transoral; diagnostic, including collection of                            specimen(s) by brushing or washing, when performed                            (separate procedure) Diagnosis Code(s):        --- Professional ---                           K22.8, Other specified diseases of esophagus  I85.00, Esophageal varices without bleeding                           K76.6, Portal hypertension                           K31.89, Other diseases of stomach and duodenum CPT copyright 2016 [employer]. All rights reserved. The codes documented in this report are preliminary and upon coder review may  be revised to meet current compliance requirements. Hildred Laser, MD Hildred Laser, MD 04/11/2017 12:29:14 PM This report has been signed electronically. Number of Addenda: 0

## 2017-04-11 NOTE — H&P (Signed)
Savannah Miles is an 62 y.o. female.   Chief Complaint: Patient is here for EGD and possible esophageal variceal banding. HPI: Patient is 62 year old Caucasian female with multiple medical problems who also has cirrhosis secondary to NASH complicated by esophageal varices.  She underwent esophageal variceal banding in May 2018.  She has never never bled from varices.  She denies nausea vomiting melena or rectal bleeding.  Patient states she is scheduled to have right and left heart cath week after next.   Past Medical History:  Diagnosis Date  . Arthritis   . Borderline glaucoma   . 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 Cancer [facility])    breast cancer  . CHF (congestive heart failure) ([place])   . Depression   . Diabetes mellitus, type II ([place])   . GERD (gastroesophageal reflux disease)   . Heart murmur   . Hyperlipidemia   . Hypertension   . Kidney disease   . Neuropathy   . Osteoporosis   . PONV (postoperative nausea and vomiting)   . Requires supplemental oxygen    4 liters at night.        Cirrhosis.  Past Surgical History:  Procedure Laterality Date  . BREAST LUMPECTOMY Left   . COLONOSCOPY WITH PROPOFOL N/A 10/27/2015   Procedure: COLONOSCOPY WITH PROPOFOL;  Surgeon: Rogene Houston, MD;  [facility];  Service: Endoscopy;  Laterality: N/A;  100 - moved to 8:30  . DILATION AND CURETTAGE OF UTERUS    . ESOPHAGEAL BANDING N/A 08/23/2016   Procedure: ESOPHAGEAL BANDING;  Surgeon: Rogene Houston, MD;  [facility];  Service: Endoscopy;  Laterality: N/A;  . ESOPHAGOGASTRODUODENOSCOPY (EGD) WITH PROPOFOL N/A 08/23/2016   Procedure: ESOPHAGOGASTRODUODENOSCOPY (EGD) WITH PROPOFOL;  Surgeon: Rogene Houston, MD;  [facility];  Service: Endoscopy;  Laterality: N/A;  11:00  . ORIF FOOT FRACTURE Left   . OTHER SURGICAL HISTORY Left Leg, Arm, Hip fx   otif, has rods and screws in each.  Marland Kitchen POLYPECTOMY  10/27/2015   Procedure: POLYPECTOMY;  Surgeon: Rogene Houston, MD;  [facility];  Service: Endoscopy;;  . SPINAL FUSION    . TRACHEOSTOMY      Family History  Problem Relation Age of Onset  . Cancer Mother   . Cancer Father   . Diabetes Sister   . Cancer Maternal Grandfather   . Cancer Paternal Grandmother    Social History:  reports that  has never smoked. she has never used smokeless tobacco. She reports that she does not drink alcohol or use drugs.  Allergies:  Allergies  Allergen Reactions  . Acyclovir And Related     unknown  . Codeine Other (See Comments)    dizziness    Medications Prior to Admission  Medication Sig Dispense Refill  . albuterol (PROVENTIL HFA;VENTOLIN HFA) 108 (90 Base) MCG/ACT inhaler Inhale 1-2 puffs into the lungs as needed for wheezing or shortness of breath.    Marland Kitchen atorvastatin (LIPITOR) 40 MG tablet Take 1 tablet (40 mg total) by mouth daily. (Patient taking differently: Take 40 mg by mouth at bedtime. ) 90 tablet 1  . gabapentin (NEURONTIN) 800 MG tablet Take 800 mg by mouth 4 (four) times daily.     . hydrALAZINE (APRESOLINE) 25 MG tablet Take 25 mg by mouth 2 (two) times daily.    Marland Kitchen HYDROcodone-acetaminophen (NORCO/VICODIN) 5-325 MG tablet Take 1 tablet by mouth every 6 (six) hours as needed (for  pain).     . insulin aspart (NOVOLOG) 100 UNIT/ML injection Inject 10-16 Units 3 (three) times daily with meals into the skin. (Patient taking differently: Inject 10-13 Units into the skin 3 (three) times daily with meals. Sliding scale insulin) 40 mL 0  . insulin detemir (LEVEMIR) 100 UNIT/ML injection Inject 0.5 mLs (50 Units total) into the skin at bedtime. 60 mL 2  . losartan (COZAAR) 100 MG tablet Take 100 mg by mouth daily.    . Melatonin 5 MG TABS Take 15 mg by mouth at bedtime.    . metFORMIN (GLUCOPHAGE) 500 MG tablet TAKE 1 TABLET TWICE DAILY WITH A MEAL 180 tablet 0  . metoprolol (LOPRESSOR) 50 MG tablet Take 75 mg by mouth 2 (two) times daily.     Marland Kitchen omeprazole (PRILOSEC) 20 MG capsule  Take 1 capsule (20 mg total) by mouth daily before breakfast. 90 capsule 1  . potassium chloride SA (K-DUR,KLOR-CON) 20 MEQ tablet Take 1 tablet (20 mEq total) by mouth daily. (Patient taking differently: Take 20 mEq by mouth 2 (two) times daily. ) 30 tablet 1  . torsemide (DEMADEX) 20 MG tablet Take 20-40 mg by mouth 2 (two) times daily. 40 mg (2 tablets) in the morning & 20 mg (1 tablet) in the afternoon with lunch  0  . Vitamin D, Ergocalciferol, (DRISDOL) 50000 units CAPS capsule Take 50,000 Units by mouth 2 (two) times a week. Wednesday & Saturday.    . denosumab (PROLIA) 60 MG/ML SOLN injection Inject 60 mg into the skin every 6 (six) months. Administer in upper arm, thigh, or abdomen  Patient states that she gets twice a year.     . hydrocortisone-pramoxine (PROCTOFOAM HC) rectal foam Place 1 applicator rectally 2 (two) times daily. (Patient not taking: Reported on 04/03/2017) 10 g 0  . nitroGLYCERIN (NITROSTAT) 0.4 MG SL tablet USE 1 TAB SUBLINGUALLY EVERY 5 MINUTES FOR CHEST PAIN AS NEEDED  0    Results for orders placed or performed during the hospital encounter of 04/11/17 (from the past 48 hour(s))  Glucose, capillary     Status: Abnormal   Collection Time: 04/11/17 10:35 AM  Result Value Ref Range   Glucose-Capillary 112 (H) 65 - 99 mg/dL  Glucose, capillary     Status: Abnormal   Collection Time: 04/11/17 11:39 AM  Result Value Ref Range   Glucose-Capillary 107 (H) 65 - 99 mg/dL   No results found.  ROS  Blood pressure 137/62, pulse 63, temperature 98.6 F (37 C), temperature source Oral, resp. rate 15, height 5\' 7"  (1.702 m), weight 237 lb (107.5 kg), SpO2 97 %. Physical Exam  Constitutional: She appears well-developed and well-nourished.  HENT:  Mouth/Throat: Oropharynx is clear and moist.  Eyes: Conjunctivae are normal. No scleral icterus.  Neck: No thyromegaly present.  Cardiovascular: Normal rate and regular rhythm.  Murmur heard. Loud systolic murmur heard all  over the precordium but best heard at aortic area.  Respiratory: Effort normal and breath sounds normal.  GI:  Abdomen is full with small area of ecchymosis in left upper quadrant.  On palpation it is soft and nontender without organomegaly or masses.  Musculoskeletal:  Pitting and nonpitting pretibial edema noted.  Lymphadenopathy:    She has no cervical adenopathy.  Neurological: She is alert.  Skin: Skin is warm and dry.     Assessment/Plan Cirrhosis complicated by esophageal varices. EGD to assess and treat esophageal varices.  Hildred Laser, MD 04/11/2017, 11:55 AM

## 2017-04-11 NOTE — Transfer of Care (Signed)
Immediate Anesthesia Transfer of Care Note  Patient: Savannah Miles  Procedure(s) Performed: ESOPHAGOGASTRODUODENOSCOPY (EGD) WITH PROPOFOL (N/A )  Patient Location: PACU  Anesthesia Type:MAC  Level of Consciousness: awake and patient cooperative  Airway & Oxygen Therapy: Patient Spontanous Breathing and Patient connected to face mask oxygen  Post-op Assessment: Report given to RN, Post -op Vital signs reviewed and stable and Patient moving all extremities  Post vital signs: Reviewed and stable  Last Vitals:  Vitals:   04/11/17 1105 04/11/17 1120  BP: (!) 146/68 137/62  Pulse:    Resp: 15 15  Temp:    SpO2: 92% 97%    Last Pain:  Vitals:   04/11/17 1044  TempSrc: Oral      Patients Stated Pain Goal: 5 (16/07/37 1062)  Complications: No apparent anesthesia complications

## 2017-04-11 NOTE — Anesthesia Preprocedure Evaluation (Signed)
Anesthesia Evaluation  Patient identified by MRN, date of birth, ID band Patient awake    Reviewed: Allergy & Precautions, NPO status , Patient's Chart, lab work & pertinent test results, reviewed documented beta blocker date and time   History of Anesthesia Complications (+) PONV and history of anesthetic complications  Airway Mallampati: I  TM Distance: >3 FB     Dental  (+) Teeth Intact, Caps,    Pulmonary neg pulmonary ROS,  Healed old trach scar.   breath sounds clear to auscultation       Cardiovascular hypertension, Pt. on medications and Pt. on home beta blockers +CHF   Rhythm:Regular Rate:Normal     Neuro/Psych PSYCHIATRIC DISORDERS (hx severe depression with suicide attempt.) Depression    GI/Hepatic GERD  ,  Endo/Other  diabetes, Type 2  Renal/GU      Musculoskeletal   Abdominal   Peds  Hematology   Anesthesia Other Findings   Reproductive/Obstetrics                             Anesthesia Physical Anesthesia Plan  ASA: III  Anesthesia Plan: MAC   Post-op Pain Management:    Induction: Intravenous  PONV Risk Score and Plan:   Airway Management Planned: Simple Face Mask  Additional Equipment:   Intra-op Plan:   Post-operative Plan:   Informed Consent: I have reviewed the patients History and Physical, chart, labs and discussed the procedure including the risks, benefits and alternatives for the proposed anesthesia with the patient or authorized representative who has indicated his/her understanding and acceptance.     Plan Discussed with:   Anesthesia Plan Comments:         Anesthesia Quick Evaluation

## 2017-04-11 NOTE — Anesthesia Postprocedure Evaluation (Signed)
Anesthesia Post Note  Patient: Savannah Miles  Procedure(s) Performed: ESOPHAGOGASTRODUODENOSCOPY (EGD) WITH PROPOFOL (N/A )  Patient location during evaluation: PACU Anesthesia Type: MAC Level of consciousness: awake and alert Pain management: satisfactory to patient Vital Signs Assessment: post-procedure vital signs reviewed and stable Respiratory status: spontaneous breathing Cardiovascular status: stable Postop Assessment: no apparent nausea or vomiting Anesthetic complications: no     Last Vitals:  Vitals:   04/11/17 1230 04/11/17 1245  BP: (!) 107/54 120/72  Pulse: 64 66  Resp: 16 13  Temp:    SpO2: 98% 96%    Last Pain:  Vitals:   04/11/17 1044  TempSrc: Oral                 Savannah Miles

## 2017-04-11 NOTE — Discharge Instructions (Signed)
Esophagogastroduodenoscopy, Care After Refer to this sheet in the next few weeks. These instructions provide you with information about caring for yourself after your procedure. Your health care provider may also give you more specific instructions. Your treatment has been planned according to current medical practices, but problems sometimes occur. Call your health care provider if you have any problems or questions after your procedure. What can I expect after the procedure? After the procedure, it is common to have:  A sore throat.  Nausea.  Bloating.  Dizziness.  Fatigue.  Follow these instructions at home:  Do not eat or drink anything until the numbing medicine (local anesthetic) has worn off and your gag reflex has returned. You will know that the local anesthetic has worn off when you can swallow comfortably.  Do not drive for 24 hours if you received a medicine to help you relax (sedative).  If your health care provider took a tissue sample for testing during the procedure, make sure to get your test results. This is your responsibility. Ask your health care provider or the department performing the test when your results will be ready.  Keep all follow-up visits as told by your health care provider. This is important. Contact a health care provider if:  You cannot stop coughing.  You are not urinating.  You are urinating less than usual. Get help right away if:  You have trouble swallowing.  You cannot eat or drink.  You have throat or chest pain that gets worse.  You are dizzy or light-headed.  You faint.  You have nausea or vomiting.  You have chills.  You have a fever.  You have severe abdominal pain.  You have black, tarry, or bloody stools. This information is not intended to replace advice given to you by your health care provider. Make sure you discuss any questions you have with your health care provider. Document Released: 03/04/2012 Document  Revised: 08/24/2015 Document Reviewed: 02/09/2015 Elsevier Interactive Patient Education  2018 [place] usual medications and diet as before. No driving for 24 hours. Office visit in 6 months.

## 2017-04-15 ENCOUNTER — Encounter (HOSPITAL_COMMUNITY): Payer: Self-pay | Admitting: Internal Medicine

## 2017-04-15 ENCOUNTER — Other Ambulatory Visit: Payer: Self-pay | Admitting: "Endocrinology

## 2017-04-15 ENCOUNTER — Telehealth: Payer: Self-pay | Admitting: "Endocrinology

## 2017-04-15 NOTE — Telephone Encounter (Signed)
metFORMIN (GLUCOPHAGE) 500 MG tablet  [facility] until her refill from mail order gets in

## 2017-04-16 NOTE — Telephone Encounter (Signed)
Rx sent 

## 2017-04-17 ENCOUNTER — Other Ambulatory Visit: Payer: Self-pay

## 2017-04-17 MED ORDER — METFORMIN HCL 500 MG PO TABS: ORAL_TABLET | ORAL | 0 refills | Status: AC

## 2017-04-28 ENCOUNTER — Telehealth (INDEPENDENT_AMBULATORY_CARE_PROVIDER_SITE_OTHER): Payer: Self-pay | Admitting: Internal Medicine

## 2017-04-28 DIAGNOSIS — K648 Other hemorrhoids: Secondary | ICD-10-CM

## 2017-04-28 MED ORDER — HYDROCORTISONE 2.5 % RE CREA: 1.0000 "application " | TOPICAL_CREAM | Freq: Two times a day (BID) | RECTAL | 1 refills | Status: AC

## 2017-04-28 NOTE — Telephone Encounter (Signed)
Rx for Proctozone sent to her pharmacy

## 2017-04-29 ENCOUNTER — Telehealth (INDEPENDENT_AMBULATORY_CARE_PROVIDER_SITE_OTHER): Payer: Self-pay | Admitting: *Deleted

## 2017-04-29 NOTE — Telephone Encounter (Signed)
Patient called and scheduled an appointment with terri on Monday, patient requested Monday for an appointment due to have a doctor's appointment every day this week. Patient states she is continuously having diarrhea, per patient she can cough and it keeps running out. Patient states she will be having open heart surgery soon and she needs to get this problem figured out. Patient requested if a nurse could call her with recommendations she could do until this appointment. Please call patient at [phone]

## 2017-04-30 ENCOUNTER — Other Ambulatory Visit (INDEPENDENT_AMBULATORY_CARE_PROVIDER_SITE_OTHER): Payer: Self-pay | Admitting: *Deleted

## 2017-04-30 DIAGNOSIS — R197 Diarrhea, unspecified: Secondary | ICD-10-CM

## 2017-04-30 NOTE — Telephone Encounter (Signed)
Savannah Miles- Please review the patient's symptoms below (encounter that was taken from receptionist). She is scheduled to see you on Monday and plans to keep that appointment. She wants to see if there is anything that she can take until she can get here on Monday. Very concerned about all this diarrhea. She is to be having open heart surgery soon also.  Please call the patient at [phone].

## 2017-04-30 NOTE — Telephone Encounter (Signed)
Tammy, she can try imodium twice a day. Will also need a GI pathogen. When I last saw her she had no GI complaints.

## 2017-04-30 NOTE — Telephone Encounter (Signed)
Patient was called, message was left with Cori Razor Instruction. Lab order was completed for GI Pathogen, patient was aware.

## 2017-05-05 ENCOUNTER — Ambulatory Visit (INDEPENDENT_AMBULATORY_CARE_PROVIDER_SITE_OTHER): Payer: Medicare PPO | Admitting: Internal Medicine

## 2017-05-05 ENCOUNTER — Encounter (INDEPENDENT_AMBULATORY_CARE_PROVIDER_SITE_OTHER): Payer: Self-pay | Admitting: Internal Medicine

## 2017-05-05 VITALS — BP 118/78 | HR 60 | Temp 97.5°F | Ht 66.5 in | Wt 243.1 lb

## 2017-05-05 DIAGNOSIS — R197 Diarrhea, unspecified: Secondary | ICD-10-CM

## 2017-05-05 NOTE — Progress Notes (Signed)
Subjective:    Patient ID: Savannah Miles, female    DOB: [birth year], 62 y.o.   MRN: [id number]  HPI Presents today with c/o diarrhea. She says she has been incontinent. She has Imodium OTC. She says last week she had a real bad cough.  She was not covered with an antibiotics. She was giving Tamiflu. Sometimes she is having x 1 stool a day and she says it is loose and watery. Also has been incontinent a few times.  Appetite is okay.  She has gained from 236 to 243.1. She says it is fluid.  Hx of cirrhosis. Iron studies ruled out hemochromatosis. Hepatitis B surface antigen and HCV antibody were negative. AFP was normal. ANA was positive at a low titer but smooth muscle antibody was negative. Antimitochondrial antibody was negative. Called last week and was having diarrhea. GI pathogen ordered.  She underwent an EGD on 04/11/2017 which revealed  Impression:               - Normal proximal esophagus and mid esophagus.                           - Scar in the distal esophagus.                           - Grade I and grade II esophageal varices.                           - Z-line regular, 40 cm from the incisors.                           - Portal hypertensive gastropathy.                           - Normal duodenal bulb and second portion of the                            duodenum.                           - No specimens collected. Undrewent a Cardiac cath 2 weeks ago in [place].  08/26/2016 EGD: cirrhosis. Rule out esophageal varices.   Impression: - Normal proximal esophagus and mid esophagus. - Grade I (three columns)and grade III (one column)  esophageal varices. Large column completely  eradicated by applying two bands. - Portal hypertensive gastropathy. - Normal duodenal bulb and second portion of the duodenum. Repeat EGD in 6 months.    10/14/2016 HA1C 7.4  Hx of CHF.  Review of Systems Past Medical History:  Diagnosis Date  . Arthritis   . Borderline glaucoma   . 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 Cancer [facility])    breast cancer  . CHF (congestive heart failure) ([place])   . Depression   . Diabetes mellitus, type II ([place])   . GERD (gastroesophageal reflux disease)   . Heart murmur   . Hyperlipidemia   . Hypertension   . Kidney disease   . Neuropathy   . Osteoporosis   . PONV (postoperative nausea and vomiting)   . Requires supplemental oxygen    4 liters at night.    Past Surgical History:  Procedure Laterality  Date  . BREAST LUMPECTOMY Left   . COLONOSCOPY WITH PROPOFOL N/A 10/27/2015   Procedure: COLONOSCOPY WITH PROPOFOL;  Surgeon: Rogene Houston, MD;  [facility];  Service: Endoscopy;  Laterality: N/A;  100 - moved to 8:30  . DILATION AND CURETTAGE OF UTERUS    . ESOPHAGEAL BANDING N/A 08/23/2016   Procedure: ESOPHAGEAL BANDING;  Surgeon: Rogene Houston, MD;  [facility];  Service: Endoscopy;  Laterality: N/A;  . ESOPHAGOGASTRODUODENOSCOPY (EGD) WITH PROPOFOL N/A 08/23/2016   Procedure: ESOPHAGOGASTRODUODENOSCOPY (EGD) WITH PROPOFOL;  Surgeon: Rogene Houston, MD;  [facility];  Service: Endoscopy;  Laterality: N/A;  11:00  . ESOPHAGOGASTRODUODENOSCOPY (EGD) WITH PROPOFOL N/A 04/11/2017   Procedure: ESOPHAGOGASTRODUODENOSCOPY (EGD) WITH PROPOFOL;  Surgeon: Rogene Houston, MD;  [facility];  Service: Endoscopy;  Laterality: N/A;  1:25  . ORIF FOOT FRACTURE Left   . OTHER SURGICAL HISTORY Left Leg, Arm, Hip fx   otif, has rods and screws in each.  Marland Kitchen POLYPECTOMY  10/27/2015   Procedure: POLYPECTOMY;  Surgeon: Rogene Houston, MD;  [facility];  Service: Endoscopy;;  . SPINAL FUSION    . TRACHEOSTOMY      Allergies  Allergen Reactions  . Acyclovir And Related     unknown  . Codeine Other (See Comments)    dizziness    Current Outpatient  Medications on File Prior to Visit  Medication Sig Dispense Refill  . albuterol (PROVENTIL HFA;VENTOLIN HFA) 108 (90 Base) MCG/ACT inhaler Inhale 1-2 puffs into the lungs as needed for wheezing or shortness of breath.    Marland Kitchen atorvastatin (LIPITOR) 40 MG tablet Take 1 tablet (40 mg total) by mouth daily. (Patient taking differently: Take 40 mg by mouth at bedtime. ) 90 tablet 1  . denosumab (PROLIA) 60 MG/ML SOLN injection Inject 60 mg into the skin every 6 (six) months. Administer in upper arm, thigh, or abdomen  Patient states that she gets twice a year.     . gabapentin (NEURONTIN) 800 MG tablet Take 800 mg by mouth 4 (four) times daily.     . hydrALAZINE (APRESOLINE) 25 MG tablet Take 25 mg by mouth 2 (two) times daily.    Marland Kitchen HYDROcodone-acetaminophen (NORCO/VICODIN) 5-325 MG tablet Take 1 tablet by mouth every 6 (six) hours as needed (for pain).     . hydrocortisone (ANUSOL-HC) 2.5 % rectal cream Place 1 application rectally 2 (two) times daily. 30 g 1  . insulin aspart (NOVOLOG) 100 UNIT/ML injection Inject 10-16 Units 3 (three) times daily with meals into the skin. (Patient taking differently: Inject 10-13 Units into the skin 3 (three) times daily with meals. Sliding scale insulin) 40 mL 0  . insulin detemir (LEVEMIR) 100 UNIT/ML injection Inject 0.5 mLs (50 Units total) into the skin at bedtime. 60 mL 2  . losartan (COZAAR) 100 MG tablet Take 100 mg by mouth daily.    . Melatonin 5 MG TABS Take 15 mg by mouth at bedtime.    . metFORMIN (GLUCOPHAGE) 500 MG tablet TAKE 1 TABLET TWICE DAILY WITH A MEAL 60 tablet 0  . metoprolol (LOPRESSOR) 50 MG tablet Take 75 mg by mouth 2 (two) times daily.     . nitroGLYCERIN (NITROSTAT) 0.4 MG SL tablet USE 1 TAB SUBLINGUALLY EVERY 5 MINUTES FOR CHEST PAIN AS NEEDED  0  . omeprazole (PRILOSEC) 20 MG capsule Take 1 capsule (20 mg total) by mouth daily before breakfast. 90 capsule 1  .  potassium chloride SA (K-DUR,KLOR-CON) 20 MEQ tablet Take 1 tablet (20 mEq  total) by mouth daily. (Patient taking differently: Take 20 mEq by mouth 2 (two) times daily. ) 30 tablet 1  . torsemide (DEMADEX) 20 MG tablet Take 20-40 mg by mouth 2 (two) times daily. 40 mg (2 tablets) in the morning & 20 mg (1 tablet) in the afternoon with lunch  0  . Vitamin D, Ergocalciferol, (DRISDOL) 50000 units CAPS capsule Take 50,000 Units by mouth 2 (two) times a week. Wednesday & Saturday.     No current facility-administered medications on file prior to visit.         Objective:   Physical Exam Blood pressure 118/78, pulse 60, temperature (!) 97.5 F (36.4 C), height 5' 6.5" (1.689 m), weight 243 lb 1.6 oz (110.3 kg). Alert and oriented. Skin warm and dry. Oral mucosa is moist.   . Sclera anicteric, conjunctivae is pink. Thyroid not enlarged. No cervical lymphadenopathy. Lungs clear. Heart regular rate and rhythm. Loud murmur heard.  Abdomen is soft. Bowel sounds are positive. No hepatomegaly. No abdominal masses felt. No tenderness.  2+  edema to lower extremities.            Assessment & Plan:  Diarrhea. Am going to get a GI pathogen. No recent antibiotics Imodium BID. Further recommendations to follow.

## 2017-05-05 NOTE — Patient Instructions (Signed)
Imodium twice a day. GI pathogen

## 2017-06-27 ENCOUNTER — Ambulatory Visit: Payer: Medicare PPO | Admitting: "Endocrinology

## 2017-06-30 DEATH — deceased

## 2017-10-09 ENCOUNTER — Telehealth: Payer: Self-pay | Admitting: "Endocrinology

## 2017-10-09 NOTE — Telephone Encounter (Signed)
Received copy of Death Cert in the mail today - marked chart and sent document to scan center

## 2017-11-03 ENCOUNTER — Ambulatory Visit (INDEPENDENT_AMBULATORY_CARE_PROVIDER_SITE_OTHER): Payer: Medicare PPO | Admitting: Internal Medicine

## 2018-01-31 IMAGING — US US ABDOMEN LIMITED
1 series · 14 of 25 positions shown · non-contrast
Comparison: No recent.

CLINICAL DATA: Cirrhosis.  HCC screening.

EXAM:
ULTRASOUND ABDOMEN LIMITED RIGHT UPPER QUADRANT

[Series 1: us abdomen limited · 0.22mm/px · 14 of 59 slices shown]
[im 1/59]
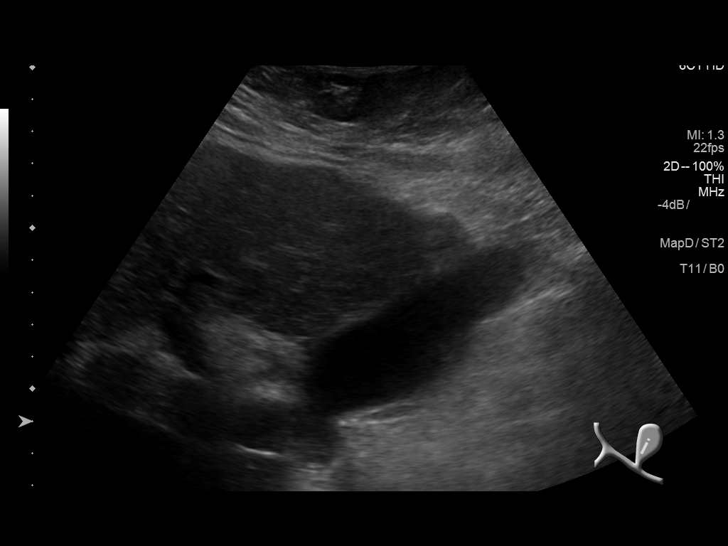
[im 5/59]
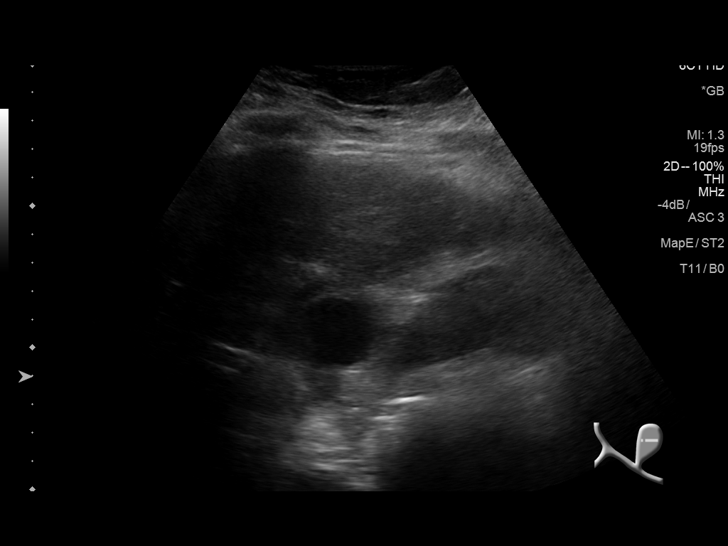
[im 10/59]
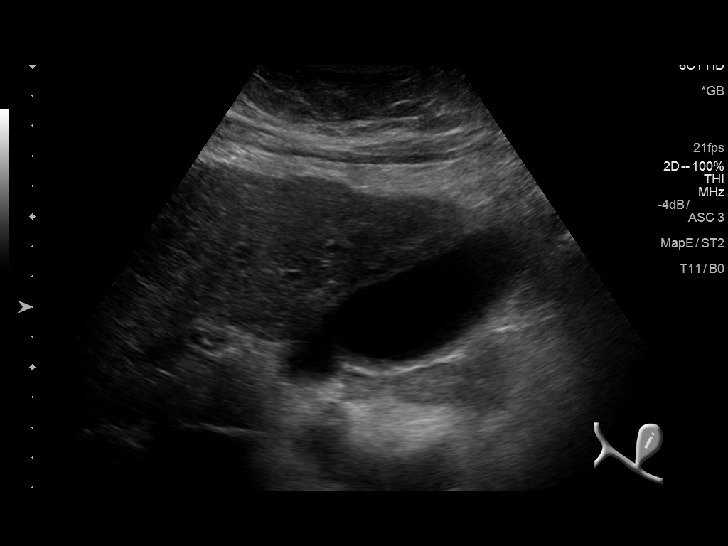
[im 15/59]
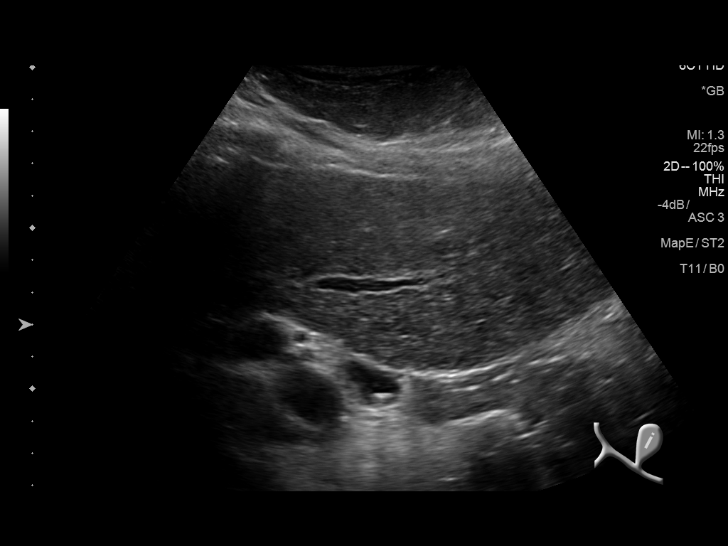
[im 20/59]
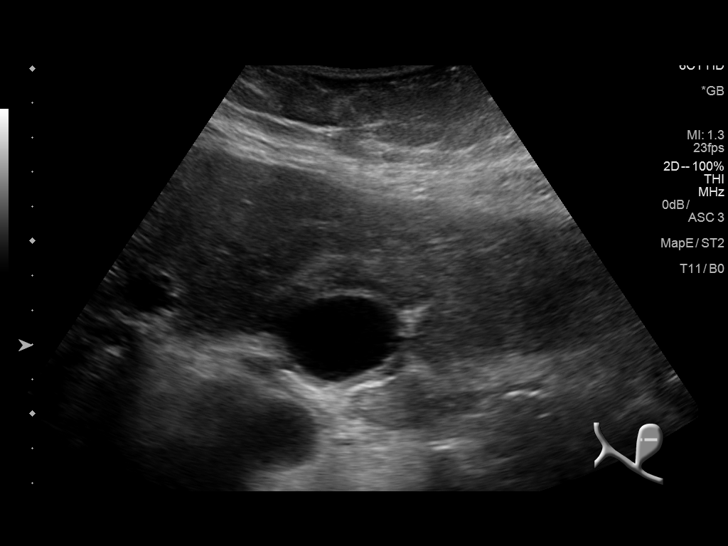
[im 22/59]
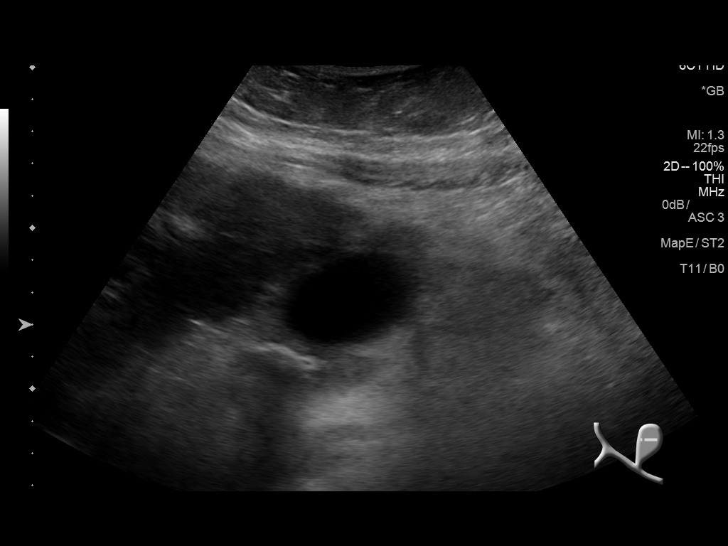
[im 27/59]
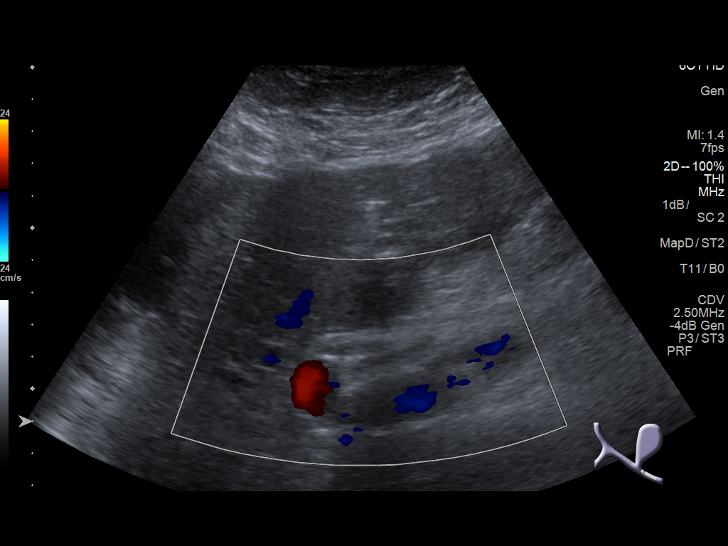
[im 32/59]
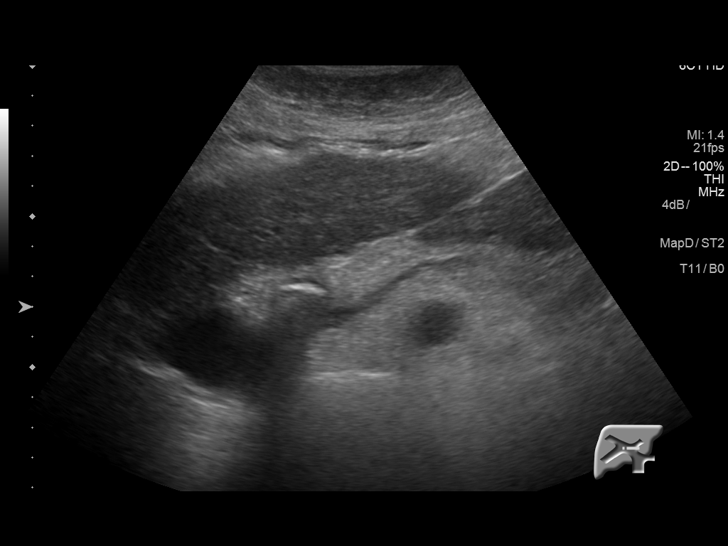
[im 37/59]
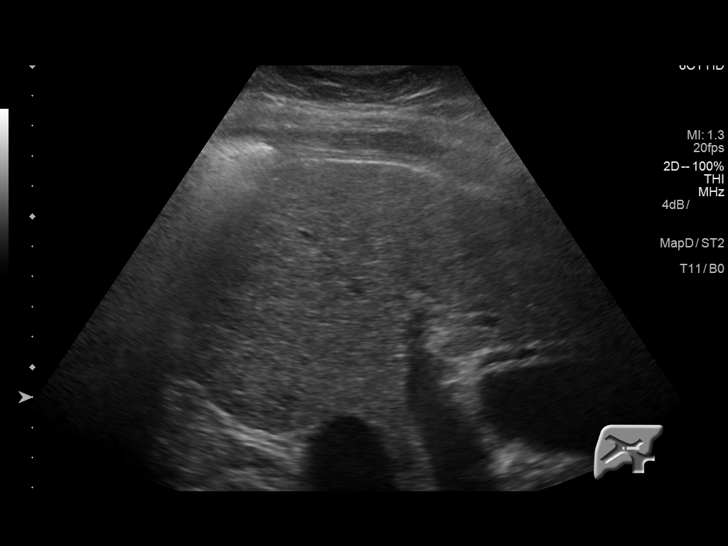
[im 39/59]
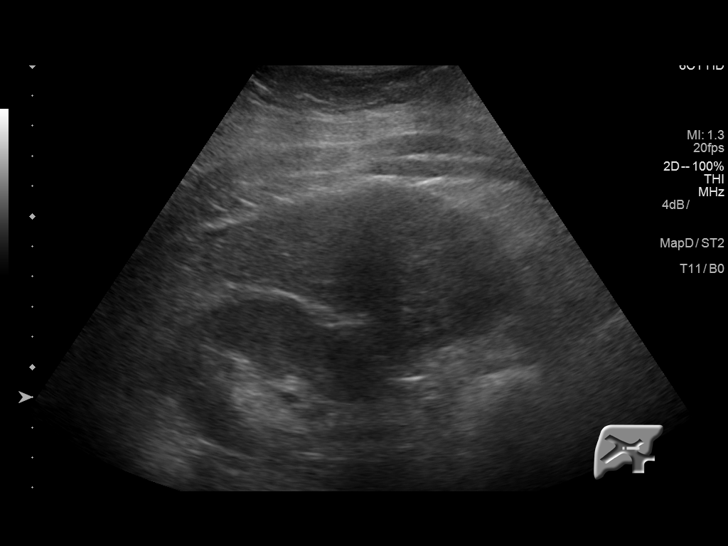
[im 44/59]
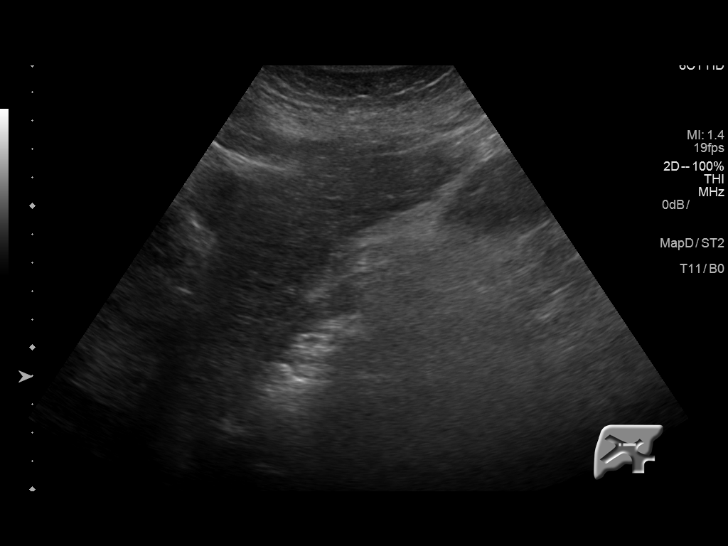
[im 49/59]
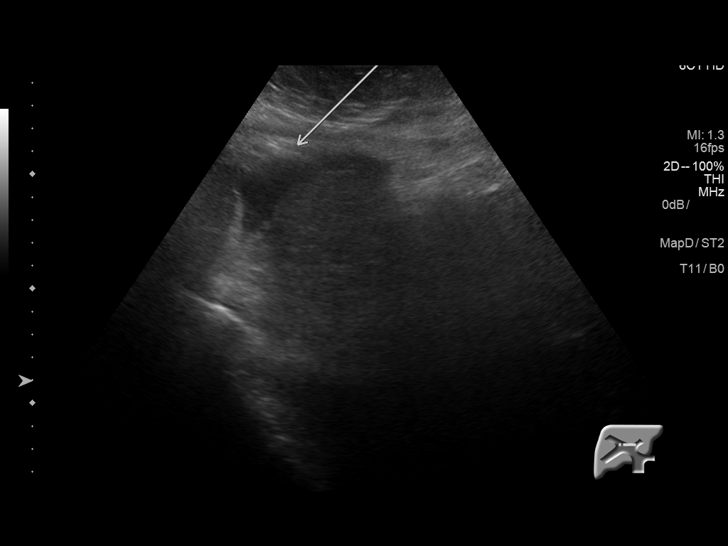
[im 54/59]
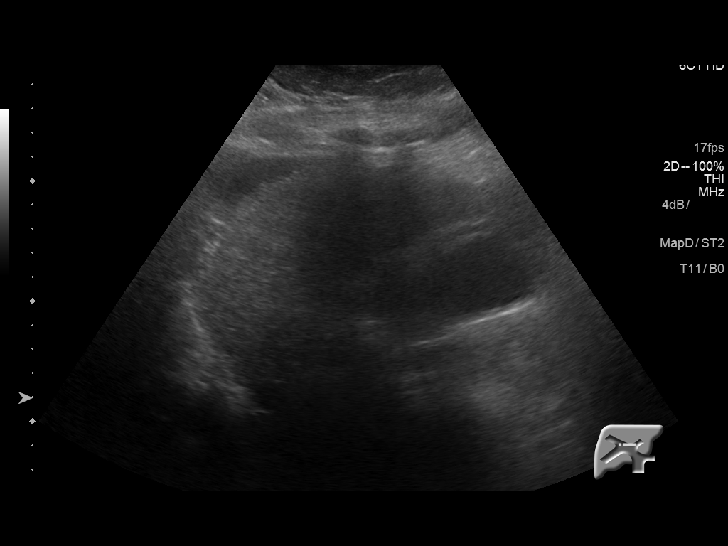
[im 59/59]
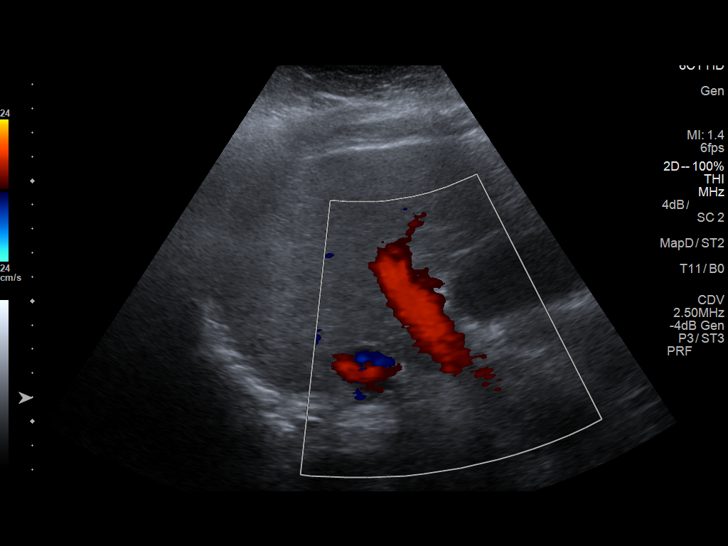

[14 of 25 positions shown; findings below may reference images not displayed]

FINDINGS: Gallbladder:

Small 6 mm gallstone noted. Gallbladder wall thickening to 9.9 mm
noted. Gallbladder wall thickening can be seen with hypoproteinemia.
Cholecystitis cannot be excluded. Negative Murphy sign.

Common bile duct:

Diameter: 7.5 mm

Liver:

Increased echogenicity consistent fatty infiltration and/or
hepatocellular disease. Portal vein is patent on color Doppler
imaging with normal direction of blood flow towards the liver. Trace
ascites.
IMPRESSION: 1. Tiny 6 mm gallstone. Gallbladder wall is thickened at 9.9 mm.
Gallbladder wall thickening can be seen with hypoproteinemia.
Cholecystitis cannot be excluded. Negative Murphy sign. No biliary
distention .

2. Increased echogenicity liver consistent fatty infiltration and/or
hepatocellular disease. No focal hepatic abnormality.

3. Trace ascites.
